# Patient Record
Sex: Male | Born: 1965 | Race: White | Hispanic: No | Smoking: Never smoker
Health system: Southern US, Community
[De-identification: ages and names within clinical notes are randomized; demographics above are authoritative.]

## PROBLEM LIST (undated history)

## (undated) DIAGNOSIS — G4733 Obstructive sleep apnea (adult) (pediatric): Secondary | ICD-10-CM

## (undated) DIAGNOSIS — E119 Type 2 diabetes mellitus without complications: Secondary | ICD-10-CM

## (undated) DIAGNOSIS — M1990 Unspecified osteoarthritis, unspecified site: Secondary | ICD-10-CM

## (undated) DIAGNOSIS — I10 Essential (primary) hypertension: Secondary | ICD-10-CM

---

## 2018-11-25 ENCOUNTER — Encounter (HOSPITAL_BASED_OUTPATIENT_CLINIC_OR_DEPARTMENT_OTHER): Payer: Self-pay | Admitting: *Deleted

## 2018-11-25 ENCOUNTER — Inpatient Hospital Stay (HOSPITAL_BASED_OUTPATIENT_CLINIC_OR_DEPARTMENT_OTHER)
Admission: EM | Admit: 2018-11-25 | Discharge: 2018-11-28 | DRG: 603 | Discharge status: Against Medical Advice | Payer: Medicare Other | Attending: Internal Medicine | Admitting: Internal Medicine

## 2018-11-25 ENCOUNTER — Other Ambulatory Visit: Payer: Self-pay

## 2018-11-25 DIAGNOSIS — Z886 Allergy status to analgesic agent status: Secondary | ICD-10-CM

## 2018-11-25 DIAGNOSIS — K219 Gastro-esophageal reflux disease without esophagitis: Secondary | ICD-10-CM | POA: Diagnosis present

## 2018-11-25 DIAGNOSIS — I10 Essential (primary) hypertension: Secondary | ICD-10-CM

## 2018-11-25 DIAGNOSIS — T63311A Toxic effect of venom of black widow spider, accidental (unintentional), initial encounter: Secondary | ICD-10-CM | POA: Diagnosis present

## 2018-11-25 DIAGNOSIS — Z79899 Other long term (current) drug therapy: Secondary | ICD-10-CM | POA: Diagnosis not present

## 2018-11-25 DIAGNOSIS — G4733 Obstructive sleep apnea (adult) (pediatric): Secondary | ICD-10-CM | POA: Diagnosis present

## 2018-11-25 DIAGNOSIS — L03311 Cellulitis of abdominal wall: Principal | ICD-10-CM | POA: Diagnosis present

## 2018-11-25 DIAGNOSIS — Z20828 Contact with and (suspected) exposure to other viral communicable diseases: Secondary | ICD-10-CM | POA: Diagnosis present

## 2018-11-25 DIAGNOSIS — L0390 Cellulitis, unspecified: Secondary | ICD-10-CM

## 2018-11-25 DIAGNOSIS — Z9989 Dependence on other enabling machines and devices: Secondary | ICD-10-CM

## 2018-11-25 DIAGNOSIS — F329 Major depressive disorder, single episode, unspecified: Secondary | ICD-10-CM | POA: Diagnosis present

## 2018-11-25 DIAGNOSIS — E1165 Type 2 diabetes mellitus with hyperglycemia: Secondary | ICD-10-CM | POA: Diagnosis not present

## 2018-11-25 DIAGNOSIS — E876 Hypokalemia: Secondary | ICD-10-CM | POA: Diagnosis present

## 2018-11-25 DIAGNOSIS — Z6841 Body Mass Index (BMI) 40.0 and over, adult: Secondary | ICD-10-CM | POA: Diagnosis not present

## 2018-11-25 DIAGNOSIS — Z794 Long term (current) use of insulin: Secondary | ICD-10-CM | POA: Diagnosis not present

## 2018-11-25 DIAGNOSIS — Z5329 Procedure and treatment not carried out because of patient's decision for other reasons: Secondary | ICD-10-CM | POA: Diagnosis present

## 2018-11-25 DIAGNOSIS — Z9119 Patient's noncompliance with other medical treatment and regimen: Secondary | ICD-10-CM | POA: Diagnosis not present

## 2018-11-25 HISTORY — DX: Obstructive sleep apnea (adult) (pediatric): G47.33

## 2018-11-25 HISTORY — DX: Essential (primary) hypertension: I10

## 2018-11-25 HISTORY — DX: Type 2 diabetes mellitus without complications: E11.9

## 2018-11-25 HISTORY — DX: Unspecified osteoarthritis, unspecified site: M19.90

## 2018-11-25 LAB — COMPREHENSIVE METABOLIC PANEL
ALT: 13 U/L (ref 0–44)
AST: 18 U/L (ref 15–41)
Albumin: 3.6 g/dL (ref 3.5–5.0)
Alkaline Phosphatase: 124 U/L (ref 38–126)
Anion gap: 14 (ref 5–15)
BUN: 16 mg/dL (ref 6–20)
CO2: 24 mmol/L (ref 22–32)
Calcium: 8.7 mg/dL — ABNORMAL LOW (ref 8.9–10.3)
Chloride: 94 mmol/L — ABNORMAL LOW (ref 98–111)
Creatinine, Ser: 0.84 mg/dL (ref 0.61–1.24)
GFR calc Af Amer: >60 mL/min (ref >60)
GFR calc non Af Amer: >60 mL/min (ref >60)
Glucose, Bld: 320 mg/dL — ABNORMAL HIGH (ref 70–99)
Potassium: 3.7 mmol/L (ref 3.5–5.1)
Sodium: 132 mmol/L — ABNORMAL LOW (ref 135–145)
Total Bilirubin: 0.6 mg/dL (ref 0.3–1.2)
Total Protein: 8.2 g/dL — ABNORMAL HIGH (ref 6.5–8.1)

## 2018-11-25 LAB — CBC WITH DIFFERENTIAL/PLATELET
Abs Immature Granulocytes: 0.04 10*3/uL (ref 0.00–0.07)
Basophils Absolute: 0.1 10*3/uL (ref 0.0–0.1)
Basophils Relative: 1 %
Eosinophils Absolute: 0.1 10*3/uL (ref 0.0–0.5)
Eosinophils Relative: 1 %
HCT: 43.1 % (ref 39.0–52.0)
Hemoglobin: 14.3 g/dL (ref 13.0–17.0)
Immature Granulocytes: 0 %
Lymphocytes Relative: 16 %
Lymphs Abs: 2 10*3/uL (ref 0.7–4.0)
MCH: 27.8 pg (ref 26.0–34.0)
MCHC: 33.2 g/dL (ref 30.0–36.0)
MCV: 83.7 fL (ref 80.0–100.0)
Monocytes Absolute: 0.5 10*3/uL (ref 0.1–1.0)
Monocytes Relative: 4 %
Neutro Abs: 9.6 10*3/uL — ABNORMAL HIGH (ref 1.7–7.7)
Neutrophils Relative %: 78 %
Platelets: 395 10*3/uL (ref 150–400)
RBC: 5.15 MIL/uL (ref 4.22–5.81)
RDW: 13.2 % (ref 11.5–15.5)
WBC: 12.3 10*3/uL — ABNORMAL HIGH (ref 4.0–10.5)
nRBC: 0 % (ref 0.0–0.2)

## 2018-11-25 LAB — URINALYSIS, ROUTINE W REFLEX MICROSCOPIC
Glucose, UA: >=500 mg/dL — AB
Hgb urine dipstick: NEGATIVE
Ketones, ur: 15 mg/dL — AB
Leukocytes,Ua: NEGATIVE
Nitrite: NEGATIVE
Protein, ur: NEGATIVE mg/dL
Specific Gravity, Urine: 1.025 (ref 1.005–1.030)
pH: 6 (ref 5.0–8.0)

## 2018-11-25 LAB — URINALYSIS, MICROSCOPIC (REFLEX)

## 2018-11-25 LAB — GLUCOSE, CAPILLARY: Glucose-Capillary: 231 mg/dL — ABNORMAL HIGH (ref 70–99)

## 2018-11-25 LAB — LACTIC ACID, PLASMA
Lactic Acid, Venous: 1.7 mmol/L (ref 0.5–1.9)
Lactic Acid, Venous: 1.5 mmol/L (ref 0.5–1.9)

## 2018-11-25 LAB — CBG MONITORING, ED
Glucose-Capillary: 287 mg/dL — ABNORMAL HIGH (ref 70–99)
Glucose-Capillary: 303 mg/dL — ABNORMAL HIGH (ref 70–99)

## 2018-11-25 MED ORDER — ACETAMINOPHEN 650 MG RE SUPP: 650.0000 mg | Freq: Four times a day (QID) | RECTAL | Status: DC | PRN

## 2018-11-25 MED ORDER — ACETAMINOPHEN 325 MG PO TABS
650.0000 mg | ORAL_TABLET | Freq: Four times a day (QID) | ORAL | Status: DC | PRN
  Administered 2018-11-26: 650 mg via ORAL
  Filled 2018-11-25: qty 2

## 2018-11-25 MED ORDER — SODIUM CHLORIDE 0.9% FLUSH: 3.0000 mL | INTRAVENOUS | Status: DC | PRN

## 2018-11-25 MED ORDER — DULOXETINE HCL 30 MG PO CPEP
30.0000 mg | ORAL_CAPSULE | Freq: Every day | ORAL | Status: DC
  Administered 2018-11-26 – 2018-11-27 (×2): 30 mg via ORAL
  Filled 2018-11-25 (×2): qty 1

## 2018-11-25 MED ORDER — PANTOPRAZOLE SODIUM 40 MG PO TBEC
40.0000 mg | DELAYED_RELEASE_TABLET | Freq: Every day | ORAL | Status: DC
  Administered 2018-11-26 – 2018-11-27 (×2): 40 mg via ORAL
  Filled 2018-11-25 (×2): qty 1

## 2018-11-25 MED ORDER — POLYETHYLENE GLYCOL 3350 17 G PO PACK: 17.0000 g | PACK | Freq: Every day | ORAL | Status: DC | PRN

## 2018-11-25 MED ORDER — SODIUM CHLORIDE 0.9% FLUSH: 3.0000 mL | Freq: Two times a day (BID) | INTRAVENOUS | Status: DC

## 2018-11-25 MED ORDER — VANCOMYCIN HCL 10 G IV SOLR
2000.0000 mg | Freq: Once | INTRAVENOUS | Status: AC
  Administered 2018-11-25: 22:00:00 2000 mg via INTRAVENOUS
  Filled 2018-11-25: qty 2000

## 2018-11-25 MED ORDER — LABETALOL HCL 5 MG/ML IV SOLN
10.0000 mg | INTRAVENOUS | Status: DC | PRN
  Filled 2018-11-25: qty 4

## 2018-11-25 MED ORDER — PIPERACILLIN-TAZOBACTAM 3.375 G IVPB 30 MIN
3.3750 g | Freq: Once | INTRAVENOUS | Status: AC
  Administered 2018-11-25: 21:00:00 3.375 g via INTRAVENOUS
  Filled 2018-11-25 (×2): qty 50

## 2018-11-25 MED ORDER — SODIUM CHLORIDE 0.9 % IV SOLN: 250.0000 mL | INTRAVENOUS | Status: DC | PRN

## 2018-11-25 MED ORDER — HYDROCODONE-ACETAMINOPHEN 5-325 MG PO TABS
1.0000 | ORAL_TABLET | ORAL | Status: DC | PRN
  Administered 2018-11-25 – 2018-11-27 (×2): 2 via ORAL
  Filled 2018-11-25 (×2): qty 2

## 2018-11-25 MED ORDER — ONDANSETRON HCL 4 MG PO TABS
4.0000 mg | ORAL_TABLET | Freq: Four times a day (QID) | ORAL | Status: DC | PRN
  Administered 2018-11-25: 4 mg via ORAL
  Filled 2018-11-25: qty 1

## 2018-11-25 MED ORDER — INSULIN DETEMIR 100 UNIT/ML ~~LOC~~ SOLN
25.0000 [IU] | Freq: Two times a day (BID) | SUBCUTANEOUS | Status: DC
  Administered 2018-11-25: 25 [IU] via SUBCUTANEOUS
  Filled 2018-11-25 (×2): qty 0.25

## 2018-11-25 MED ORDER — VANCOMYCIN HCL 10 G IV SOLR
1750.0000 mg | Freq: Two times a day (BID) | INTRAVENOUS | Status: DC
  Administered 2018-11-26 – 2018-11-27 (×4): 1750 mg via INTRAVENOUS
  Filled 2018-11-25 (×5): qty 1750

## 2018-11-25 MED ORDER — ONDANSETRON HCL 4 MG/2ML IJ SOLN: 4.0000 mg | Freq: Four times a day (QID) | INTRAMUSCULAR | Status: DC | PRN

## 2018-11-25 MED ORDER — INSULIN ASPART 100 UNIT/ML ~~LOC~~ SOLN
0.0000 [IU] | SUBCUTANEOUS | Status: DC
  Administered 2018-11-25: 3 [IU] via SUBCUTANEOUS

## 2018-11-25 MED ORDER — HYDRALAZINE HCL 20 MG/ML IJ SOLN
10.0000 mg | Freq: Once | INTRAMUSCULAR | Status: AC
  Administered 2018-11-25: 10 mg via INTRAVENOUS
  Filled 2018-11-25: qty 1

## 2018-11-25 MED ORDER — VANCOMYCIN HCL 1000 MG IV SOLR
INTRAVENOUS | Status: AC
  Filled 2018-11-25: qty 2000

## 2018-11-25 NOTE — Progress Notes (Signed)
Pharmacy Antibiotic Note  Zachary Salazar is a 53 y.o. male admitted on 11/25/2018 with cellulitis.  Pharmacy has been consulted for vancomycin dosing.  Presenting with a wound with skin necrosis (~1.5" near L lower abdomen). WBC 12.3, LA 1.7, afebrile. Scr 0.84 (normCrCl 102 mL/min). Currently receiving zosyn therapy.   Plan: Vancomycin 2g IV once then 1750 mg IV every 12 hours (estAUC 484)  Monitor renal fx, cx results, clinical pic, and vanc levels as appropriate  Height: 5\' 11"  (180.3 cm) Weight: (!) 342 lb (155.1 kg) IBW/kg (Calculated) : 75.3  Temp (24hrs), Avg:98.6 F (37 C), Min:98.6 F (37 C), Max:98.6 F (37 C)  Recent Labs  Lab 11/25/18 1843 11/25/18 1920  WBC 12.3*  --   CREATININE 0.84  --   LATICACIDVEN  --  1.7    Estimated Creatinine Clearance: 152.4 mL/min (by C-G formula based on SCr of 0.84 mg/dL).    No Known Allergies  Antimicrobials this admission: Zosyn 10/19 >>  Vancomycin 10/19 >>   Dose adjustments this admission: N/A  Microbiology results: 10/19 BCx: sent 10/19 COVID: sent   Thank you for allowing pharmacy to be a part of this patient's care.  Antonietta Jewel, PharmD, BCCCP Clinical Pharmacist  Phone: [phone]  Please check AMION for all [place] phone numbers After 10:00 PM, call Welcome [phone] 11/25/2018 8:37 PM

## 2018-11-25 NOTE — ED Triage Notes (Signed)
Wound with skin necrosis about 1.5" circular area to his left lower abdomen x 3 days. Nausea. Diabetic.

## 2018-11-25 NOTE — H&P (Signed)
History and Physical    Zachary Salazar ERX:[id number] DOB: 18-Feb-1965 DOA: 11/25/2018  PCP: Belia Heman, NP   Patient coming from: Home   Chief Complaint: Skin infection   HPI: Zachary Salazar is a 54 y.o. male with medical history significant for obesity (BMI 48), poorly controlled type 2 diabetes mellitus, hypertension, depression, and OSA, presenting to the emergency department for evaluation of a lower abdominal wound with foul odor and drainage.  Patient reports that he noticed a spot on his left lower abdominal wall approximately 3 days prior to presentation, states that there has been a foul odor and purulent drainage from the site.  Patient reports that he has been putting salt and Neosporin on the wound at home.  He has not noticed any fevers.  He denies any shortness of breath, cough, or chest pain.  [facility] ED Course: Upon arrival to the ED, patient is found to be afebrile, saturating well on room air, tachycardic to 120, and with stable blood pressure.  Chemistry panel is notable for a glucose of 320 and CBC features a leukocytosis to 12,300.  Blood cultures were collected and the patient was started on vancomycin and Zosyn in the ED.  COVID-19 testing is in process.  Review of Systems:  All other systems reviewed and apart from HPI, are negative.  Past Medical History:  Diagnosis Date  . Arthritis   . Diabetes mellitus without complication ([place])   . Hypertension   . OSA on CPAP     History reviewed. No pertinent surgical history.   reports that he has never smoked. He has never used smokeless tobacco. He reports that he does not drink alcohol or use drugs.  Allergies  Allergen Reactions  . Nsaids Other (See Comments)    GI Upset (intolerance) Hx of GIB     History reviewed. No pertinent family history.   Prior to Admission medications   Medication Sig Start Date End Date Taking? Authorizing Provider  DULoxetine (CYMBALTA) 30 MG capsule  Take 30 mg by mouth daily after breakfast.  11/19/18  Yes [provider]  hydrochlorothiazide (HYDRODIURIL) 25 MG tablet Take 25 mg by mouth daily after breakfast.  11/19/18  Yes [provider]  pantoprazole (PROTONIX) 40 MG tablet Take 40 mg by mouth daily after breakfast.  11/19/18  Yes [provider]  NOVOLOG MIX 70/30 (70-30) 100 UNIT/ML injection Inject 50-60 Units into the skin See admin instructions. Inject 60 units every morning and 50 units every evening 07/29/18   [provider]    Physical Exam: Vitals:   11/25/18 1831 11/25/18 2004 11/25/18 2130 11/25/18 2232  BP: (!) 180/107 (!) 153/92 (!) 148/96 (!) 161/111  Pulse: (!) 123 (!) 110 97 (!) 101  Resp: 20 18 16 20   Temp: 98.6 F (37 C)   98.4 F (36.9 C)  TempSrc: Oral   Oral  SpO2: 100% 100% 100% 99%  Weight:      Height:        Constitutional: NAD, calm  Eyes: PERTLA, lids and conjunctivae normal ENMT: Mucous membranes are moist. Posterior pharynx clear of any exudate or lesions.   Neck: normal, supple, no masses, no thyromegaly Respiratory: no wheezing, no crackles. Normal respiratory effort. No accessory muscle use.  Cardiovascular: S1 & S2 heard, regular rate and rhythm. No extremity edema.   Abdomen: No distension, no tenderness, soft. Bowel sounds normal.  Musculoskeletal: no clubbing / cyanosis. No joint deformity upper and lower extremities.  Skin: lower left abdominal wall wound with surrounding erythema, purulent drainage, and foul odor. Warm, dry, well-perfused. Neurologic: CN 2-12 grossly intact. Sensation intact. Strength 5/5 in all 4 limbs.  Psychiatric: Alert and oriented x 3. Calm, cooperative.     Labs on Admission: I have personally reviewed following labs and imaging studies  CBC: Recent Labs  Lab 11/25/18 1843  WBC 12.3*  NEUTROABS 9.6*  HGB 14.3  HCT 43.1  MCV 83.7  PLT 395   Basic Metabolic Panel: Recent Labs  Lab 11/25/18 1843  NA 132*  K  3.7  CL 94*  CO2 24  GLUCOSE 320*  BUN 16  CREATININE 0.84  CALCIUM 8.7*   GFR: Estimated Creatinine Clearance: 152.4 mL/min (by C-G formula based on SCr of 0.84 mg/dL). Liver Function Tests: Recent Labs  Lab 11/25/18 1843  AST 18  ALT 13  ALKPHOS 124  BILITOT 0.6  PROT 8.2*  ALBUMIN 3.6   No results for input(s): LIPASE, AMYLASE in the last 168 hours. No results for input(s): AMMONIA in the last 168 hours. Coagulation Profile: No results for input(s): INR, PROTIME in the last 168 hours. Cardiac Enzymes: No results for input(s): CKTOTAL, CKMB, CKMBINDEX, TROPONINI in the last 168 hours. BNP (last 3 results) No results for input(s): PROBNP in the last 8760 hours. HbA1C: No results for input(s): HGBA1C in the last 72 hours. CBG: Recent Labs  Lab 11/25/18 1840 11/25/18 1901  GLUCAP 287* 303*   Lipid Profile: No results for input(s): CHOL, HDL, LDLCALC, TRIG, CHOLHDL, LDLDIRECT in the last 72 hours. Thyroid Function Tests: No results for input(s): TSH, T4TOTAL, FREET4, T3FREE, THYROIDAB in the last 72 hours. Anemia Panel: No results for input(s): VITAMINB12, FOLATE, FERRITIN, TIBC, IRON, RETICCTPCT in the last 72 hours. Urine analysis:    Component Value Date/Time   COLORURINE YELLOW 11/25/2018 1843   APPEARANCEUR CLEAR 11/25/2018 1843   LABSPEC 1.025 11/25/2018 1843   PHURINE 6.0 11/25/2018 1843   GLUCOSEU >=500 (A) 11/25/2018 1843   HGBUR NEGATIVE 11/25/2018 1843   BILIRUBINUR SMALL (A) 11/25/2018 1843   KETONESUR 15 (A) 11/25/2018 1843   PROTEINUR NEGATIVE 11/25/2018 1843   NITRITE NEGATIVE 11/25/2018 1843   LEUKOCYTESUR NEGATIVE 11/25/2018 1843   Sepsis Labs: @LABRCNTIP (procalcitonin:4,lacticidven:4) )No results found for this or any previous visit (from the past 240 hour(s)).   Radiological Exams on Admission: No results found.  EKG: Not performed.   Assessment/Plan   1. Cellulitis  - Presents with 3 days of progressively enlarging lower left  abdominal wall wound with surrounding erythema, foul odor, and purulent drainage  - He is tachycardic with leukocytosis on admission  - He was cultured and started on broad-spectrum empiric antibiotics in ED  - Continue vancomycin, follow cultures and clinical course   2. Insulin-dependent DM  - A1c was 12.2% in January 2020  - Continue insulin with Levemir and Novolog correctional    3. Hypertension  - Hold HCTZ while hydrating, use labetalol IVP's as needed for now     PPE: Mask, face shield  DVT prophylaxis: SCD's  Code Status: Full  Family Communication: Discussed with patient  Consults called: None  Admission status: Inpatient. Patient has severe skin infection with systemic signs and symptoms, complicated by uncontrolled diabetes, may need surgical intervention if fails to respond to IV antibiotics, and will require inpatient management.     February 2020, MD [facility] Pager [phone]  If 7PM-7AM, please contact night-coverage [email or web address] Password [email or web address]  11/25/2018, 10:33 PM

## 2018-11-25 NOTE — ED Provider Notes (Signed)
[facility] EMERGENCY DEPARTMENT Provider Note   CSN: [id number] Arrival date & time: 11/25/18  1821     History   Chief Complaint Chief Complaint  Patient presents with  . Abscess    HPI Zachary Salazar is a 53 y.o. male.     The history is provided by the patient and medical records. No language interpreter was used.  Rash Location:  Torso and leg Torso rash location:  Abd LLQ Leg rash location:  L lower leg and R lower leg Quality: draining, painful and redness   Pain details:    Quality:  Aching   Onset quality:  Gradual   Duration:  3 days   Timing:  Constant   Progression:  Worsening Severity:  Moderate Progression:  Worsening Chronicity:  New Context: insect bite/sting (possibe)   Relieved by:  Nothing Worsened by:  Nothing Associated symptoms: abdominal pain (at site) and induration   Associated symptoms: no diarrhea, no fatigue (chills present), no fever, no headaches, no nausea, no shortness of breath, not vomiting and not wheezing     Past Medical History:  Diagnosis Date  . Arthritis   . Diabetes mellitus without complication ([facility])   . Hypertension     There are no active problems to display for this patient.   History reviewed. No pertinent surgical history.      Home Medications    Prior to Admission medications   Medication Sig Start Date End Date Taking? Authorizing Provider  DULoxetine (CYMBALTA) 30 MG capsule TAKE 1 CAPSULE BY MOUTH TWICE DAILY 11/19/18  Yes [provider]  hydrochlorothiazide (HYDRODIURIL) 25 MG tablet TAKE 1 TABLET(25 MG) BY MOUTH DAILY 11/19/18  Yes [provider]  pantoprazole (PROTONIX) 40 MG tablet TAKE 1 TABLET(40 MG) BY MOUTH DAILY 11/19/18  Yes [provider]    Family History No family history on file.  Social History Social History   Tobacco Use  . Smoking status: Never Smoker  . Smokeless tobacco: Never Used  Substance Use Topics  . Alcohol use: Never   Frequency: Never  . Drug use: Never     Allergies   Patient has no known allergies.   Review of Systems Review of Systems  Constitutional: Positive for chills. Negative for diaphoresis, fatigue (chills present) and fever.  HENT: Negative for congestion.   Eyes: Negative for visual disturbance.  Respiratory: Negative for cough, chest tightness, shortness of breath and wheezing.   Cardiovascular: Negative for chest pain, palpitations and leg swelling.  Gastrointestinal: Positive for abdominal pain (at site). Negative for constipation, diarrhea, nausea and vomiting.  Genitourinary: Negative for flank pain.  Musculoskeletal: Negative for back pain, neck pain and neck stiffness.  Skin: Positive for rash and wound.  Neurological: Negative for light-headedness and headaches.  Psychiatric/Behavioral: Negative for agitation.  All other systems reviewed and are negative.    Physical Exam Updated Vital Signs BP (!) 180/107   Pulse (!) 123   Temp 98.6 F (37 C) (Oral)   Resp 20   Ht  (1.803 m)   Wt (!) 155.1 kg   SpO2 100%   BMI 47.70 kg/m   Physical Exam Vitals signs and nursing note reviewed.  Constitutional:      General: He is not in acute distress.    Appearance: He is well-developed. He is not ill-appearing, toxic-appearing or diaphoretic.  HENT:     Head: Normocephalic and atraumatic.     Mouth/Throat:     Mouth: Mucous membranes are moist.  Eyes:     Conjunctiva/sclera: Conjunctivae normal.     Pupils: Pupils are equal, round, and reactive to light.  Neck:     Musculoskeletal: Neck supple. No muscular tenderness.  Cardiovascular:     Rate and Rhythm: Regular rhythm. Tachycardia present.     Pulses: Normal pulses.     Heart sounds: No murmur.  Pulmonary:     Effort: Pulmonary effort is normal. No respiratory distress.     Breath sounds: Normal breath sounds. No wheezing, rhonchi or rales.  Chest:     Chest wall: No tenderness.  Abdominal:     General:  Bowel sounds are normal.     Palpations: Abdomen is soft.     Tenderness: There is abdominal tenderness (at wound site) in the left lower quadrant. There is no right CVA tenderness or left CVA tenderness.    Musculoskeletal:        General: Tenderness present.     Right lower leg: He exhibits tenderness. No edema.     Left lower leg: He exhibits tenderness. No edema.     Comments: Erythema and wounds on bilateral calves with also no evidence of abscess on ultrasound.  Good pulse, sensation, and strength in legs.  Foul-smelling purulence present.  Skin:    General: Skin is warm and dry.     Capillary Refill: Capillary refill takes less than 2 seconds.     Findings: Erythema and rash present.  Neurological:     General: No focal deficit present.     Mental Status: He is alert.         ED Treatments / Results  Labs (all labs ordered are listed, but only abnormal results are displayed) Labs Reviewed  CBC WITH DIFFERENTIAL/PLATELET - Abnormal; Notable for the following components:      Result Value   WBC 12.3 (*)    Neutro Abs 9.6 (*)    All other components within normal limits  COMPREHENSIVE METABOLIC PANEL - Abnormal; Notable for the following components:   Sodium 132 (*)    Chloride 94 (*)    Glucose, Bld 320 (*)    Calcium 8.7 (*)    Total Protein 8.2 (*)    All other components within normal limits  URINALYSIS, ROUTINE W REFLEX MICROSCOPIC - Abnormal; Notable for the following components:   Glucose, UA >=500 (*)    Bilirubin Urine SMALL (*)    Ketones, ur 15 (*)    All other components within normal limits  URINALYSIS, MICROSCOPIC (REFLEX) - Abnormal; Notable for the following components:   Bacteria, UA FEW (*)    All other components within normal limits  GLUCOSE, CAPILLARY - Abnormal; Notable for the following components:   Glucose-Capillary 231 (*)    All other components within normal limits  CBG MONITORING, ED - Abnormal; Notable for the following components:    Glucose-Capillary 287 (*)    All other components within normal limits  CBG MONITORING, ED - Abnormal; Notable for the following components:   Glucose-Capillary 303 (*)    All other components within normal limits  CULTURE, BLOOD (ROUTINE X 2)  CULTURE, BLOOD (ROUTINE X 2)  SARS CORONAVIRUS 2 (TAT 6-24 HRS)  LACTIC ACID, PLASMA  LACTIC ACID, PLASMA  HEMOGLOBIN A1C  HIV ANTIBODY (ROUTINE TESTING W REFLEX)  BASIC METABOLIC PANEL  CBC WITH DIFFERENTIAL/PLATELET    EKG None  Radiology No results found.  Procedures Procedures (including critical care time)  CRITICAL CARE Performed by: Canary Brim [employer] Total critical  care time: 35 minutes Critical care time was exclusive of separately billable procedures and treating other patients. Critical care was necessary to treat or prevent imminent or life-threatening deterioration. Critical care was time spent personally by me on the following activities: development of treatment plan with patient and/or surrogate as well as nursing, discussions with consultants, evaluation of patient's response to treatment, examination of patient, obtaining history from patient or surrogate, ordering and performing treatments and interventions, ordering and review of laboratory studies, ordering and review of radiographic studies, pulse oximetry and re-evaluation of patient's condition.   Medications Ordered in ED Medications  vancomycin (VANCOCIN) 1,750 mg in sodium chloride 0.9 % 500 mL IVPB (has no administration in time range)  vancomycin (VANCOCIN) 1000 MG powder (has no administration in time range)  DULoxetine (CYMBALTA) DR capsule 30 mg (has no administration in time range)  pantoprazole (PROTONIX) EC tablet 40 mg (has no administration in time range)  sodium chloride flush (NS) 0.9 % injection 3 mL (3 mLs Intravenous Not Given 11/25/18 2256)  sodium chloride flush (NS) 0.9 % injection 3 mL (has no administration in time range)  0.9 %   sodium chloride infusion (has no administration in time range)  acetaminophen (TYLENOL) tablet 650 mg (has no administration in time range)    Or  acetaminophen (TYLENOL) suppository 650 mg (has no administration in time range)  HYDROcodone-acetaminophen (NORCO/VICODIN) 5-325 MG per tablet 1-2 tablet (2 tablets Oral Given 11/25/18 2330)  polyethylene glycol (MIRALAX / GLYCOLAX) packet 17 g (has no administration in time range)  ondansetron (ZOFRAN) tablet 4 mg (4 mg Oral Given 11/25/18 2330)    Or  ondansetron (ZOFRAN) injection 4 mg ( Intravenous See Alternative 11/25/18 2330)  insulin detemir (LEVEMIR) injection 25 Units (25 Units Subcutaneous Given 11/25/18 2330)  labetalol (NORMODYNE) injection 10 mg (has no administration in time range)  insulin aspart (novoLOG) injection 0-15 Units (has no administration in time range)  insulin aspart (novoLOG) injection 0-5 Units (has no administration in time range)  piperacillin-tazobactam (ZOSYN) IVPB 3.375 g (0 g Intravenous Stopped 11/25/18 2130)  vancomycin (VANCOCIN) 2,000 mg in sodium chloride 0.9 % 500 mL IVPB (2,000 mg Intravenous New Bag/Given 11/25/18 2132)  hydrALAZINE (APRESOLINE) injection 10 mg (10 mg Intravenous Given 11/25/18 2347)     Initial Impression / Assessment and Plan / ED Course  I have reviewed the triage vital signs and the nursing notes.  Pertinent labs & imaging results that were available during my care of the patient were reviewed by me and considered in my medical decision making (see chart for details).        Charlotte SanesLarry Darcey is a 53 y.o. male with a past medical history significant for hypertension and diabetes who presents with multiple skin infections.  Patient reports that for the last 3 days he has had a rapidly worsening wound develop on his left lower quadrant of the abdomen and both of his calves.  He reports that he thinks he was "bit by a spider or something", but he does not remember actually seeing an  insect.  He says that over the last 24 hours it has enlarged and has looked black around the edges with purulence and foul smell.  There is also surrounding erythema that seems to be spreading slightly.  He reports he tried to use some Neosporin and some salt water solution that did not seem to help.  He reports he also has wounds developing on both of his calves that are similar but  has not yet opened and started draining.  Patient denies any fevers he does report he has been chills and not take his temperature at home.  He reports is at the nausea no vomiting.  He denies any other abdominal pain or chest pain.  On exam, patient has a foul-smelling 2 cm wound on his left lower quadrant.  See picture.  Bedside ultrasound utilized with no evidence of abscess underlying.  Abdomen is tender in this location.  It is foul-smelling.  Patient also has 2 areas of erythema on his calves on both legs.  No abscess seen on ultrasound of them.  Good pulse, sensation, and strength in legs.  Patient is warm to the touch and is tachycardic and tachypneic on arrival.  Clinical I am concerned patient is having a diabetic cellulitis that is rapidly worsening with some necrosis on the edges.  Patient will have screening labs and will be given antibiotics.  Labs show leukocytosis.  Lactic acid is not elevated.  Based on the fact I do not see abscess tracking downward, do not feel he needs CT abdomen pelvis at this time however if symptoms were to worsen or he was to have more diffuse abdominal pain, would consider CT to further evaluate.  Patient was given broad-spectrum antibiotics as he has concerning vital signs with these areas of infection.  Spoke with Dr. Myna Hidalgo with hospital service who agrees for admission.   Patient be admitted for further management of diabetic cellulitis.   Final Clinical Impressions(s) / ED Diagnoses   Final diagnoses:  Cellulitis of abdominal wall    ED Discharge Orders    None      Clinical Impression: 1. Cellulitis of abdominal wall     Disposition: Admit  This note was prepared with assistance of Dragon voice recognition software. Occasional wrong-word or sound-a-like substitutions may have occurred due to the inherent limitations of voice recognition software.     Masayuki Sakai, Gwenyth Allegra, MD 11/26/18 [phone]

## 2018-11-26 DIAGNOSIS — L03311 Cellulitis of abdominal wall: Secondary | ICD-10-CM | POA: Diagnosis not present

## 2018-11-26 LAB — GLUCOSE, RANDOM: Glucose, Bld: 449 mg/dL — ABNORMAL HIGH (ref 70–99)

## 2018-11-26 LAB — BASIC METABOLIC PANEL
Anion gap: 11 (ref 5–15)
BUN: 22 mg/dL — ABNORMAL HIGH (ref 6–20)
CO2: 23 mmol/L (ref 22–32)
Calcium: 8.2 mg/dL — ABNORMAL LOW (ref 8.9–10.3)
Chloride: 96 mmol/L — ABNORMAL LOW (ref 98–111)
Creatinine, Ser: 0.94 mg/dL (ref 0.61–1.24)
GFR calc Af Amer: >60 mL/min (ref >60)
GFR calc non Af Amer: >60 mL/min (ref >60)
Glucose, Bld: 421 mg/dL — ABNORMAL HIGH (ref 70–99)
Potassium: 3.2 mmol/L — ABNORMAL LOW (ref 3.5–5.1)
Sodium: 130 mmol/L — ABNORMAL LOW (ref 135–145)

## 2018-11-26 LAB — CBC WITH DIFFERENTIAL/PLATELET
Abs Immature Granulocytes: 0.05 10*3/uL (ref 0.00–0.07)
Basophils Absolute: 0.1 10*3/uL (ref 0.0–0.1)
Basophils Relative: 0 %
Eosinophils Absolute: 0.2 10*3/uL (ref 0.0–0.5)
Eosinophils Relative: 2 %
HCT: 39.1 % (ref 39.0–52.0)
Hemoglobin: 13 g/dL (ref 13.0–17.0)
Immature Granulocytes: 0 %
Lymphocytes Relative: 23 %
Lymphs Abs: 2.6 10*3/uL (ref 0.7–4.0)
MCH: 28.1 pg (ref 26.0–34.0)
MCHC: 33.2 g/dL (ref 30.0–36.0)
MCV: 84.4 fL (ref 80.0–100.0)
Monocytes Absolute: 0.6 10*3/uL (ref 0.1–1.0)
Monocytes Relative: 5 %
Neutro Abs: 7.9 10*3/uL — ABNORMAL HIGH (ref 1.7–7.7)
Neutrophils Relative %: 70 %
Platelets: 319 10*3/uL (ref 150–400)
RBC: 4.63 MIL/uL (ref 4.22–5.81)
RDW: 13.2 % (ref 11.5–15.5)
WBC: 11.3 10*3/uL — ABNORMAL HIGH (ref 4.0–10.5)
nRBC: 0 % (ref 0.0–0.2)

## 2018-11-26 LAB — SARS CORONAVIRUS 2 (TAT 6-24 HRS): SARS Coronavirus 2: NEGATIVE

## 2018-11-26 LAB — GLUCOSE, CAPILLARY
Glucose-Capillary: 466 mg/dL — ABNORMAL HIGH (ref 70–99)
Glucose-Capillary: 398 mg/dL — ABNORMAL HIGH (ref 70–99)
Glucose-Capillary: 84 mg/dL (ref 70–99)
Glucose-Capillary: 291 mg/dL — ABNORMAL HIGH (ref 70–99)
Glucose-Capillary: 192 mg/dL — ABNORMAL HIGH (ref 70–99)

## 2018-11-26 LAB — HEMOGLOBIN A1C
Hgb A1c MFr Bld: 15.8 % — ABNORMAL HIGH (ref 4.8–5.6)
Mean Plasma Glucose: 406.76 mg/dL

## 2018-11-26 LAB — HIV ANTIBODY (ROUTINE TESTING W REFLEX): HIV Screen 4th Generation wRfx: NONREACTIVE

## 2018-11-26 MED ORDER — INSULIN ASPART 100 UNIT/ML ~~LOC~~ SOLN: 0.0000 [IU] | Freq: Every day | SUBCUTANEOUS | Status: DC

## 2018-11-26 MED ORDER — INSULIN ASPART 100 UNIT/ML ~~LOC~~ SOLN
12.0000 [IU] | Freq: Once | SUBCUTANEOUS | Status: AC
  Administered 2018-11-26: 08:00:00 12 [IU] via SUBCUTANEOUS

## 2018-11-26 MED ORDER — SENNOSIDES-DOCUSATE SODIUM 8.6-50 MG PO TABS: 2.0000 | ORAL_TABLET | Freq: Every evening | ORAL | Status: DC | PRN

## 2018-11-26 MED ORDER — POLYETHYLENE GLYCOL 3350 17 G PO PACK: 17.0000 g | PACK | Freq: Every day | ORAL | Status: DC | PRN

## 2018-11-26 MED ORDER — INSULIN GLARGINE 100 UNIT/ML ~~LOC~~ SOLN
45.0000 [IU] | Freq: Every day | SUBCUTANEOUS | Status: DC
  Administered 2018-11-26: 10:00:00 45 [IU] via SUBCUTANEOUS
  Filled 2018-11-26 (×2): qty 0.45

## 2018-11-26 MED ORDER — INSULIN ASPART 100 UNIT/ML ~~LOC~~ SOLN
0.0000 [IU] | Freq: Three times a day (TID) | SUBCUTANEOUS | Status: DC
  Administered 2018-11-26: 09:00:00 15 [IU] via SUBCUTANEOUS
  Administered 2018-11-26: 17:00:00 11 [IU] via SUBCUTANEOUS
  Administered 2018-11-27: 3 [IU] via SUBCUTANEOUS
  Administered 2018-11-27: 12:00:00 4 [IU] via SUBCUTANEOUS

## 2018-11-26 MED ORDER — INSULIN ASPART 100 UNIT/ML ~~LOC~~ SOLN
6.0000 [IU] | Freq: Three times a day (TID) | SUBCUTANEOUS | Status: DC
  Administered 2018-11-26: 17:00:00 6 [IU] via SUBCUTANEOUS

## 2018-11-26 MED ORDER — HYDRALAZINE HCL 20 MG/ML IJ SOLN: 10.0000 mg | INTRAMUSCULAR | Status: DC | PRN

## 2018-11-26 MED ORDER — INSULIN ASPART 100 UNIT/ML ~~LOC~~ SOLN: 0.0000 [IU] | Freq: Three times a day (TID) | SUBCUTANEOUS | Status: DC

## 2018-11-26 MED ORDER — POTASSIUM CHLORIDE CRYS ER 20 MEQ PO TBCR
40.0000 meq | EXTENDED_RELEASE_TABLET | Freq: Once | ORAL | Status: AC
  Administered 2018-11-26: 13:00:00 40 meq via ORAL
  Filled 2018-11-26: qty 2

## 2018-11-26 NOTE — Consult Note (Signed)
[facility] Nurse wound consult note Reason for Consult:LEft abdomen cellulitis  Wound type: infectious Pressure Injury POA: NA Measurement: 4 cm x 3.2 cm x 0.4 cm  Wound WGY:KZLDJT and ruddy red with fibrinous tissue Drainage (amount, consistency, odor) minimal serosanguinous  Necrotic odor Periwound:erythema Dressing procedure/placement/frequency:Cleanse wound to left flank with NS and pat dry.  Fill wound depth with piece of Aquacel Ag.  Cover with ABD pad and tape.  Change twice weekly Tuesday and Saturday.  Will not follow at this time.  Please re-consult if needed.  Domenic Moras MSN, RN, FNP-BC CWON Wound, Ostomy, Continence Nurse Pager [phone]

## 2018-11-26 NOTE — Progress Notes (Signed)
Inpatient Diabetes Program Recommendations  AACE/ADA: New Consensus Statement on Inpatient Glycemic Control (2015)  Target Ranges:  Prepandial:   less than 140 mg/dL      Peak postprandial:   less than 180 mg/dL (1-2 hours)      Critically ill patients:  140 - 180 mg/dL   Lab Results  Component Value Date   GLUCAP 84 11/26/2018   HGBA1C 15.8 (H) 11/26/2018    Review of Glycemic Control  Diabetes history: DM2 Outpatient Diabetes medications: Novolog 70/30 60 units QAM and 50 units QPM Current orders for Inpatient glycemic control: Lantus 45 units QD, Novolog 0-20 units tidwc and 0-5 units QHS + 6 units tidwc  HgbA1C - 15.8% CBGs today 421, 466, 398, 84 mg/dL Eating 100%.  Inpatient Diabetes Program Recommendations:     Agree with orders. Will likely need more meal coverage insulin. Will hold off on titrating Novolog since 84 mg/dL at lunch.   Will speak with pt regarding HgbA1C of 15.8%. It is doubtful pt is taking his insulin at home.   Continue to follow.  Thank you. Lorenda Peck, RD, LDN, CDE Inpatient Diabetes Coordinator [phone]

## 2018-11-26 NOTE — Progress Notes (Signed)
PROGRESS NOTE    Zachary Salazar  VOZ:[id number] DOB: 03/15/1965 DOA: 11/25/2018 PCP: Sharren Bridge, NP   Brief Narrative:  53 year old with history of poorly controlled diabetes mellitus type 2, morbid obesity, depression, essential hypertension, obstructive sleep apnea presented with lower abdominal wound with foul smelling drainage.  In the ER she was noted to be hyperglycemic with mild leukocytosis.  Started on vancomycin and Zosyn for abdominal wall cellulitis   Assessment & Plan:   Principal Problem:   Cellulitis of left abdominal wall Active Problems:   Uncontrolled diabetes mellitus with hyperglycemia (HCC)   Hypertension   OSA on CPAP  Abdominal wall cellulitis with purulent drainage -Continue IV vancomycin -Follow-up blood cultures.  IV fluids -Supportive care -If necessary will get wound cultures  Insulin-dependent diabetes mellitus type 2, poorly controlled secondary to significant hyperglycemia -Continue Levemir and insulin sliding scale -Check hemoglobin A1c, previously was 12 -Consult diabetic coordinator  Essential hypertension -Holding off on hydrochlorothiazide.  Gentle hydration.    DVT prophylaxis: SCDs if needed Code Status: Full code Family Communication: None at bedside 53 year old with history of poorly controlled diabetes mellitus type 2, morbid obesity, depression, essential hypertension, obstructive sleep apnea presented with lower abdominal wound with foul smelling drainage.  In the ER she was noted to be hyperglycemic with mild leukocytosis.  Started on vancomycin and Zosyn for abdominal wall cellulitis   Assessment & Plan:   Principal Active Problems:   Uncontrolled diabetes mellitus with hyperglycemia (HCC)   Hypertension   OSA on CPAP  Abdominal wall cellulitis with purulent drainage -Continue IV vancomycin -Follow-up blood cultures.  IV fluids -Supportive care -If necessary will get wound cultures  Insulin-dependent diabetes mellitus type 2, poorly controlled secondary to significant hyperglycemia -Continue Levemir and insulin sliding scale -Check hemoglobin A1c, previously was 12 -Consult diabetic coordinator  Essential hypertension -Holding off on hydrochlorothiazide.  Gentle hydration.    DVT prophylaxis: SCDs if needed Code Status: Full code Family Communication: None at bedside Disposition Plan: Maintain hospital stay for IV antibiotics and better control of his blood glucose.  Control his blood glucose will be very important to help his healing  Consultants:   Diabetic coordinator  Procedures:   None  Antimicrobials:   Vancomycin day 2   Subjective: Tells me he was working outside the building couple of days ago when he thinks he may have been bitten by something.  First tells me he was bitten by a black widow and then brown recluse.  Later on goes on telling me he does not know.  Worsened over the course the last 2 days therefore came to the hospital.  Review of Systems Otherwise negative except as per HPI, including: General: Denies  fever, chills, night sweats or unintended weight loss. Resp: Denies cough, wheezing, shortness of breath. Cardiac: Denies chest pain, palpitations, orthopnea, paroxysmal nocturnal dyspnea. GI: Denies abdominal pain, nausea, vomiting, diarrhea or constipation GU: Denies dysuria, frequency, hesitancy or incontinence MS: Denies muscle aches, joint pain or swelling Neuro: Denies headache, neurologic deficits (focal weakness, numbness, tingling), abnormal gait Psych: Denies anxiety, depression, SI/HI/AVH Skin: Denies new rashes or lesions ID: Denies sick contacts, exotic exposures, travel  Objective: Vitals:   11/25/18 2004 11/25/18 2130 11/25/18 2232 11/26/18 0550  BP: (!) 153/92 (!) 148/96 (!) 161/111 (!) 148/83  Pulse: (!) 110 97 (!) 101 95  Resp: 18 16 20 20   Temp:   98.4 F (36.9 C) 98 F (36.7 C)  TempSrc:   Oral Oral  SpO2: 100% 100% 99% 98%  Weight:   (!) 156.7 kg   Height:   5\' 11"  (1.803 m)     Intake/Output Summary (Last 24 hours) at 11/26/2018 0832 Last data filed at 11/26/2018 0550 Gross per 24 hour  Intake 43.42 ml  Output 600 ml  Net -556.58 ml   Filed Weights   11/25/18 1829 11/25/18 2232  Weight: (!) 155.1 kg (!) 156.7 kg    Examination:  General exam: Appears calm and comfortable  Respiratory system: Clear to auscultation. Respiratory effort normal. Cardiovascular system: S1 & S2 heard, RRR. No JVD, murmurs, rubs, gallops or clicks. No pedal edema. Gastrointestinal system: Abdomen is nondistended, soft and nontender. No organomegaly or masses felt. Normal bowel sounds heard. Central nervous system: Alert and oriented. No focal neurological deficits. Extremities: Symmetric 5 x 5 power. Skin: Left lower quadrant superficial ulcerative area with surrounding erythema and mild pruritus.  Left medial calf scab with surrounding erythema, right posterior calf scab with very mild surrounding erythema. Psychiatry: Judgement and insight appear normal. Mood & affect  appropriate.     Data Reviewed:   CBC: Recent Labs  Lab 11/25/18 1843 11/26/18 0357  WBC 12.3* 11.3*  NEUTROABS 9.6* 7.9*  HGB 14.3 13.0  HCT 43.1 39.1  MCV 83.7 84.4  PLT 395 319   Basic Metabolic Panel: Recent Labs  Lab 11/25/18 1843 11/26/18 0357 11/26/18 0758  NA 132* 130*  --   K 3.7 3.2*  --   CL 94* 96*  --   CO2 24 23  --   GLUCOSE 320* 421* 449*  BUN 16 22*  --   CREATININE 0.84 0.94  --   CALCIUM 8.7* 8.2*  --    GFR: Estimated Creatinine Clearance: 137.1 mL/min (by C-G formula based on SCr of 0.94 mg/dL). Liver Function Tests: Recent Labs  Lab 11/25/18 1843  AST 18  ALT 13  ALKPHOS 124  BILITOT 0.6  PROT 8.2*  ALBUMIN 3.6   No results for input(s): LIPASE, AMYLASE in the last 168 hours. No results for input(s): AMMONIA in the last 168 hours. Coagulation Profile: No results for input(s): INR, PROTIME in the last 168 hours. Cardiac Enzymes: No results for input(s): CKTOTAL, CKMB, CKMBINDEX, TROPONINI in the last 168 hours. BNP (last 3 results) No results for input(s): PROBNP in the last 8760 hours. HbA1C: No results for input(s): HGBA1C in the last 72 hours. CBG: Recent Labs  Lab 11/25/18 1840 11/25/18 1901 11/25/18 2314 11/26/18 0708  GLUCAP 287* 303* 231* 466*   Lipid Profile: No results for input(s): CHOL, HDL, LDLCALC, TRIG, CHOLHDL, LDLDIRECT in the last 72 hours. Thyroid Function Tests: No results for input(s): TSH, T4TOTAL, FREET4, T3FREE, THYROIDAB in the last 72 hours. Anemia Panel: No results for input(s): VITAMINB12, FOLATE, FERRITIN, TIBC, IRON, RETICCTPCT in the last 72 hours. Sepsis Labs: Recent Labs  Lab 11/25/18 1920 11/25/18 2100  LATICACIDVEN 1.7 1.5    Recent Results (from the past 240 hour(s))  SARS CORONAVIRUS 2 (TAT 6-24 HRS) Nasopharyngeal Nasopharyngeal Swab     Status: None   Collection Time: 11/25/18  8:49 PM   Specimen: Nasopharyngeal Swab  Result Value Ref Range Status   SARS Coronavirus 2  NEGATIVE NEGATIVE Final    Comment: (NOTE) SARS-CoV-2 target nucleic acids are NOT DETECTED. The SARS-CoV-2 RNA is generally detectable in upper and lower respiratory specimens during the acute phase of infection. Negative results do not preclude SARS-CoV-2 infection, do not rule out co-infections with other pathogens, a for IV antibiotics and better control of his blood glucose.  Control his blood glucose will be very important to help his healing  Consultants:   Diabetic coordinator  Procedures:   None  Antimicrobials:   Vancomycin day 2   Subjective: Tells me he was working outside the building couple of days ago when he thinks he may have been bitten by something.  First tells me he was bitten by a black widow and then brown recluse.  Later on goes on telling me he does not know.  Worsened over the course the last 2 days therefore came to the hospital.  Review of Systems Otherwise negative except as per HPI, including: General: Denies  fever, chills, night sweats or unintended weight loss. Resp: Denies cough, wheezing, shortness of breath. Cardiac: Denies chest pain, palpitations, orthopnea, paroxysmal nocturnal dyspnea. GI: Denies abdominal pain, nausea, vomiting, diarrhea or constipation GU: Denies dysuria, frequency, hesitancy or incontinence MS: Denies muscle aches, joint pain or swelling Neuro: Denies headache, neurologic deficits (focal weakness, numbness, tingling), abnormal gait Psych: Denies anxiety, depression, SI/HI/AVH Skin: Denies new rashes or lesions ID: Denies sick contacts, exotic exposures, travel  Objective: Vitals:   11/25/18 2004 11/25/18 2130 11/25/18 2232 11/26/18 0550  BP: (!) 153/92 (!) 148/96 (!) 161/111 (!) 148/83  Pulse: (!) 110 97 (!) 101 95  Resp: 18 16 20 20   Temp:   98.4 F (36.9 C) 98 F (36.7 C)  TempSrc:   Oral Oral  SpO2: 100% 100% 99% 98%  Weight:   (!) 156.7 kg   Height:   5\' 11"  (1.803 m)     Intake/Output Summary (Last 24 hours) at 11/26/2018 0832 Last data filed at 11/26/2018 0550 Gross per 24 hour  Intake 43.42 ml  Output 600 ml  Net -556.58 ml   Filed Weights   11/25/18 1829 11/25/18 2232  Weight: (!) 155.1 kg (!) 156.7 kg    Examination:  General exam: Appears calm and comfortable  Respiratory system: Clear to auscultation. Respiratory effort normal. Cardiovascular system: S1 & S2 heard, RRR. No JVD, murmurs, rubs, gallops or clicks. No pedal edema. Gastrointestinal system: Abdomen is nondistended, soft and nontender. No organomegaly or masses felt. Normal bowel sounds heard. Central nervous system: Alert and oriented. No focal neurological deficits. Extremities: Symmetric 5 x 5 power. Skin: Left lower quadrant superficial ulcerative area with surrounding erythema and mild pruritus.  Left medial calf scab with surrounding erythema, right posterior calf scab with very mild surrounding erythema. Psychiatry: Judgement and insight appear normal. Mood & affect  appropriate.     Data Reviewed:   CBC: Recent Labs  Lab 11/25/18 1843 11/26/18 0357  WBC 12.3* 11.3*  NEUTROABS 9.6* 7.9*  HGB 14.3 13.0  HCT 43.1 39.1  MCV 83.7 84.4  PLT 395 319   Basic Metabolic Panel: Recent Labs  Lab 11/25/18 1843 11/26/18 0357 11/26/18 0758  NA 132* 130*  --   K 3.7 3.2*  --   CL 94* 96*  --   CO2 24 23  --   GLUCOSE 320* 421* 449*  BUN 16 22*  --   CREATININE 0.84 0.94  --   CALCIUM 8.7* 8.2*  --    GFR: Estimated Creatinine Clearance: 137.1 mL/min (by C-G formula based on SCr of 0.94 mg/dL). Liver Function Tests: Recent Labs  Lab 11/25/18 1843  AST 18  ALT 13  ALKPHOS 124  BILITOT 0.6  PROT 8.2*  ALBUMIN 3.6   No results for input(s): LIPASE, AMYLASE in the last 168 hours. No results for input(s): AMMONIA in the last 168 hours. Coagulation Profile: No results for input(s): INR, PROTIME in the last 168 hours. Cardiac Enzymes: No results for input(s): CKTOTAL, CKMB, CKMBINDEX, TROPONINI in the last 168 hours. BNP (last 3 results) No results for input(s): PROBNP in the last 8760 hours. HbA1C: No results for input(s): HGBA1C in the last 72 hours. CBG: Recent Labs  Lab 11/25/18 1840 11/25/18 1901 11/25/18 2314 11/26/18 0708  GLUCAP 287* 303* 231* 466*   Lipid Profile: No results for input(s): CHOL, HDL, LDLCALC, TRIG, CHOLHDL, LDLDIRECT in the last 72 hours. Thyroid Function Tests: No results for input(s): TSH, T4TOTAL, FREET4, T3FREE, THYROIDAB in the last 72 hours. Anemia Panel: No results for input(s): VITAMINB12, FOLATE, FERRITIN, TIBC, IRON, RETICCTPCT in the last 72 hours. Sepsis Labs: Recent Labs  Lab 11/25/18 1920 11/25/18 2100  LATICACIDVEN 1.7 1.5    Recent Results (from the past 240 hour(s))  SARS CORONAVIRUS 2 (TAT 6-24 HRS) Nasopharyngeal Nasopharyngeal Swab     Status: None   Collection Time: 11/25/18  8:49 PM   Specimen: Nasopharyngeal Swab  Result Value Ref Range Status   SARS Coronavirus 2  NEGATIVE NEGATIVE Final    Comment: (NOTE) SARS-CoV-2 target nucleic acids are NOT DETECTED. The SARS-CoV-2 RNA is generally detectable in upper and lower respiratory specimens during the acute phase of infection. Negative results do not preclude SARS-CoV-2 infection, do not rule out co-infections with other pathogens, and should not be used as the sole basis for treatment or other patient management decisions. Negative results must be combined with clinical observations, patient history, and epidemiological information. The expected result is Negative. Fact Sheet for Patients: HairSlick.nohttps://www.fda.gov/media/138098/download Fact Sheet for Healthcare Providers: quierodirigir.comhttps://www.fda.gov/media/138095/download This test is not yet approved or cleared by the [place] FDA and  has been authorized for detection and/or diagnosis of SARS-CoV-2 by FDA under an Emergency Use Authorization (EUA). This EUA will remain  in effect (meaning this test can be used) for the duration of the COVID-19 declaration under Section 56 4(b)(1) of the Act, 21 U.S.C. section 360bbb-3(b)(1), unless the authorization is terminated or revoked sooner. Performed at [facility], [address] [place], [place] [address]          Radiology Studies: No results found.      Scheduled Meds: . DULoxetine  30 mg Oral QPC breakfast  .  insulin aspart  0-15 Units Subcutaneous TID WC  . insulin aspart  0-5 Units Subcutaneous QHS  . insulin detemir  25 Units Subcutaneous BID  . pantoprazole  40 mg Oral QPC breakfast  . sodium chloride flush  3 mL Intravenous Q12H  . vancomycin       Continuous Infusions: . sodium chloride    . vancomycin       LOS: 1 day   Time spent= 25 mins     Arsenio Loader, MD [facility]  If 7PM-7AM, please contact night-coverage  11/26/2018, 8:32 AM

## 2018-11-26 NOTE — Progress Notes (Signed)
Pt morning labs show blood glucose 421. Rechecked and it was 466. MD amin paged ordered STAT lab vairfication and 12 units Novolog.

## 2018-11-27 ENCOUNTER — Inpatient Hospital Stay (HOSPITAL_COMMUNITY): Payer: Medicare Other

## 2018-11-27 DIAGNOSIS — L03311 Cellulitis of abdominal wall: Principal | ICD-10-CM

## 2018-11-27 LAB — COMPREHENSIVE METABOLIC PANEL
ALT: 11 U/L (ref 0–44)
AST: 14 U/L — ABNORMAL LOW (ref 15–41)
Albumin: 3 g/dL — ABNORMAL LOW (ref 3.5–5.0)
Alkaline Phosphatase: 92 U/L (ref 38–126)
Anion gap: 12 (ref 5–15)
BUN: 19 mg/dL (ref 6–20)
CO2: 21 mmol/L — ABNORMAL LOW (ref 22–32)
Calcium: 8.4 mg/dL — ABNORMAL LOW (ref 8.9–10.3)
Chloride: 101 mmol/L (ref 98–111)
Creatinine, Ser: 1.01 mg/dL (ref 0.61–1.24)
GFR calc Af Amer: >60 mL/min (ref >60)
GFR calc non Af Amer: >60 mL/min (ref >60)
Glucose, Bld: 179 mg/dL — ABNORMAL HIGH (ref 70–99)
Potassium: 3.4 mmol/L — ABNORMAL LOW (ref 3.5–5.1)
Sodium: 134 mmol/L — ABNORMAL LOW (ref 135–145)
Total Bilirubin: 0.8 mg/dL (ref 0.3–1.2)
Total Protein: 6.9 g/dL (ref 6.5–8.1)

## 2018-11-27 LAB — CBC
HCT: 40.5 % (ref 39.0–52.0)
Hemoglobin: 13.2 g/dL (ref 13.0–17.0)
MCH: 27.9 pg (ref 26.0–34.0)
MCHC: 32.6 g/dL (ref 30.0–36.0)
MCV: 85.6 fL (ref 80.0–100.0)
Platelets: 300 10*3/uL (ref 150–400)
RBC: 4.73 MIL/uL (ref 4.22–5.81)
RDW: 13.4 % (ref 11.5–15.5)
WBC: 9.1 10*3/uL (ref 4.0–10.5)
nRBC: 0 % (ref 0.0–0.2)

## 2018-11-27 LAB — MRSA PCR SCREENING: MRSA by PCR: NEGATIVE

## 2018-11-27 LAB — GLUCOSE, CAPILLARY
Glucose-Capillary: 192 mg/dL — ABNORMAL HIGH (ref 70–99)
Glucose-Capillary: 170 mg/dL — ABNORMAL HIGH (ref 70–99)
Glucose-Capillary: 130 mg/dL — ABNORMAL HIGH (ref 70–99)
Glucose-Capillary: 158 mg/dL — ABNORMAL HIGH (ref 70–99)

## 2018-11-27 LAB — MAGNESIUM: Magnesium: 1.9 mg/dL (ref 1.7–2.4)

## 2018-11-27 IMAGING — US US ABDOMEN LIMITED
1 series · 12 of 12 positions shown · non-contrast
Comparison: None.

CLINICAL DATA: 53-year-old male with cellulitis.

EXAM:
ULTRASOUND ABDOMEN LIMITED

[Series 1: us abdomen limited · 12 of 12 slices shown]
[im 1/12]
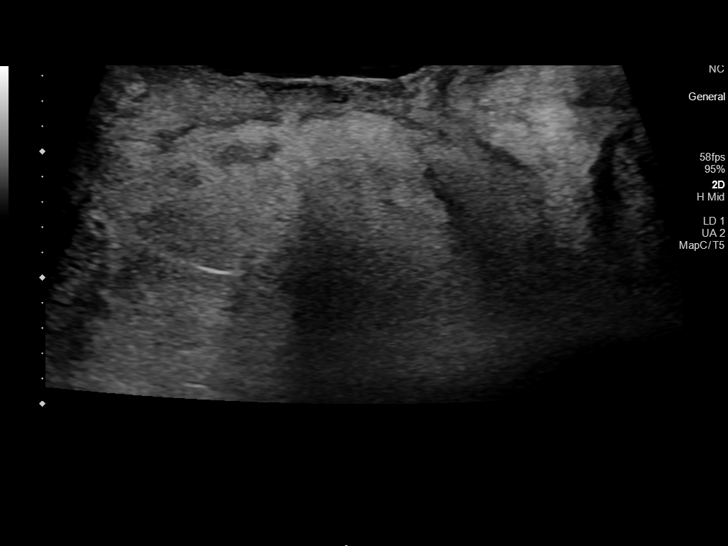
[im 2/12]
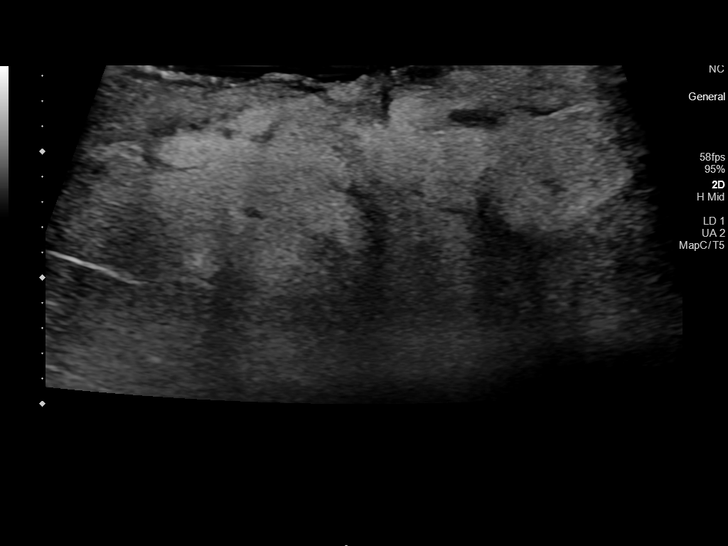
[im 3/12]
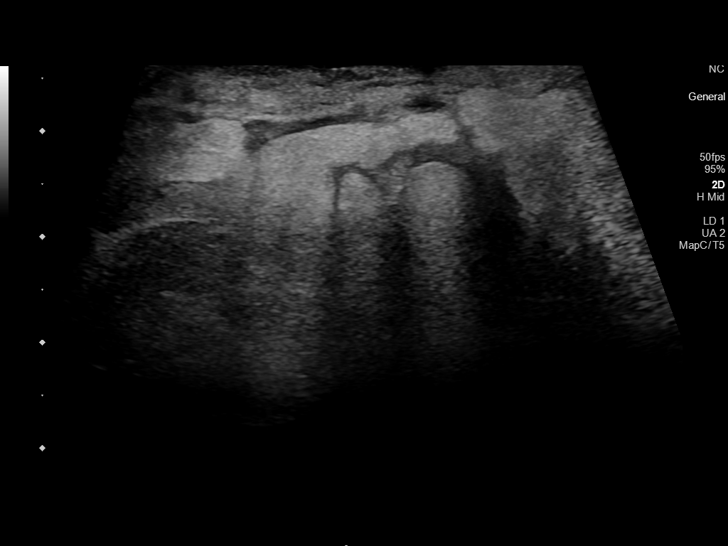
[im 4/12]
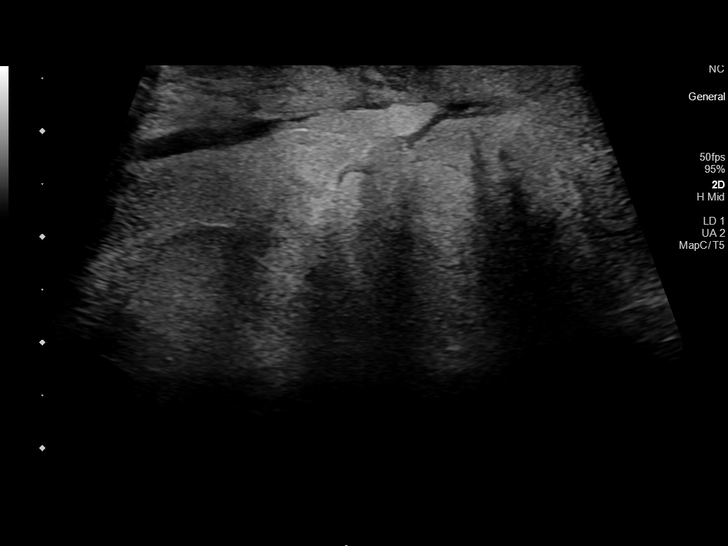
[im 5/12]
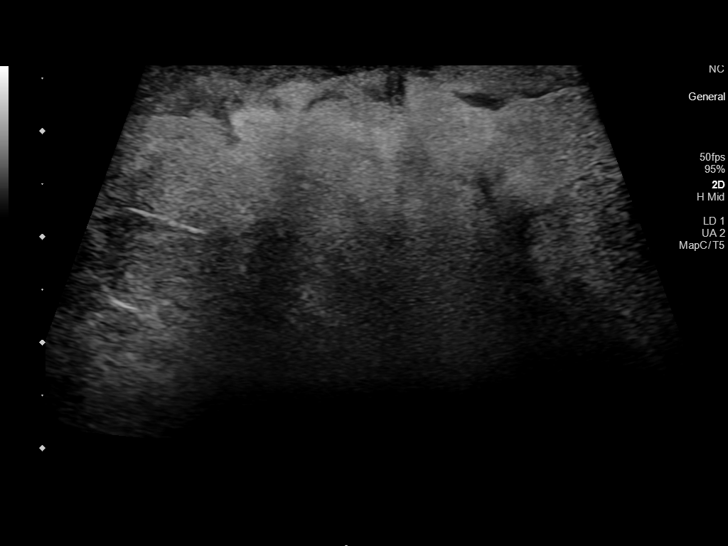
[im 6/12]
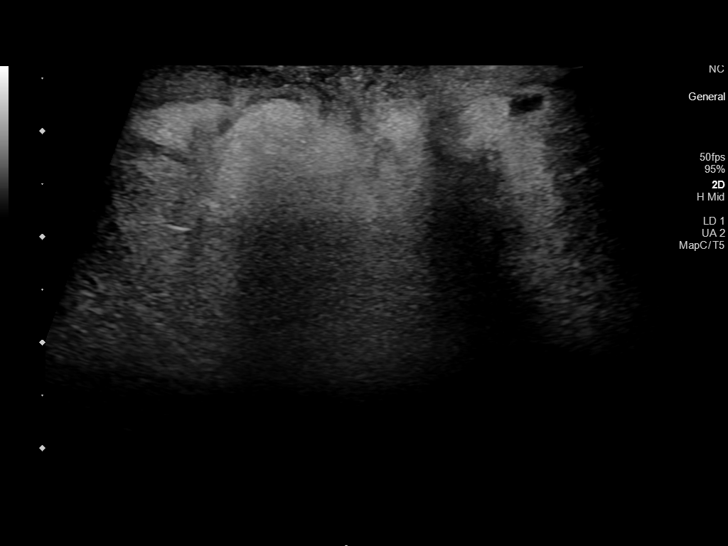
[im 7/12]
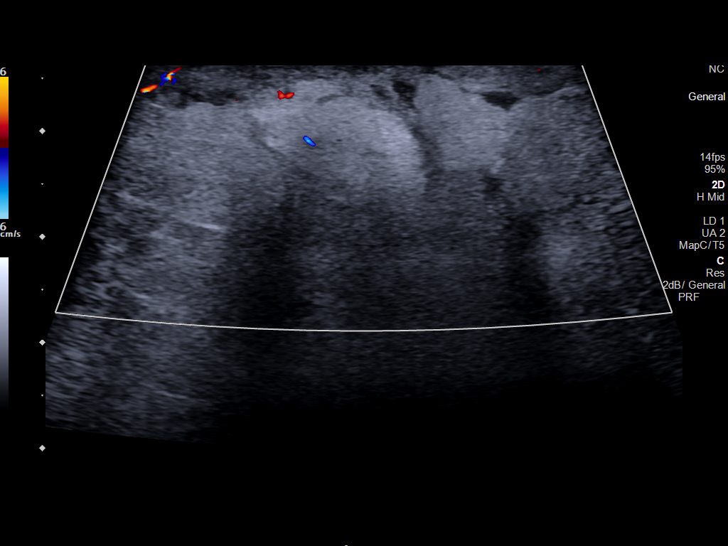
[im 8/12]
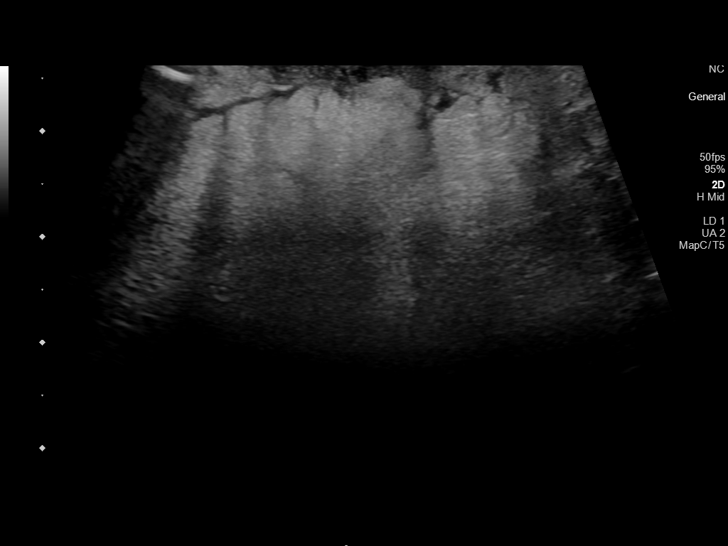
[im 9/12]
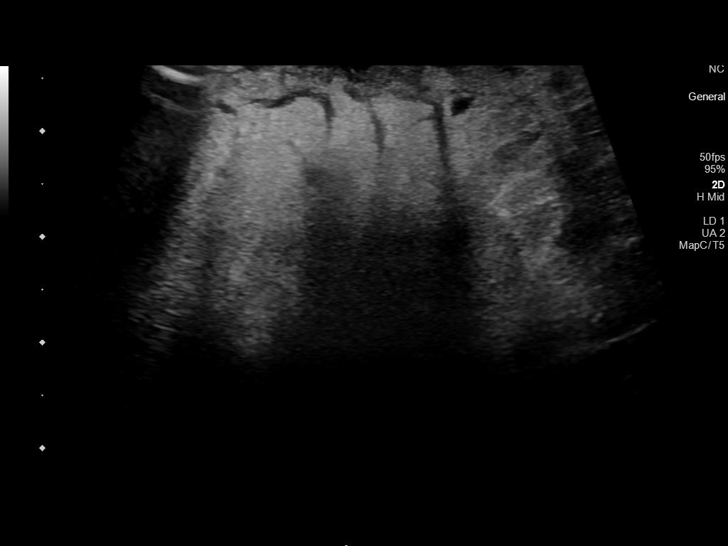
[im 10/12]
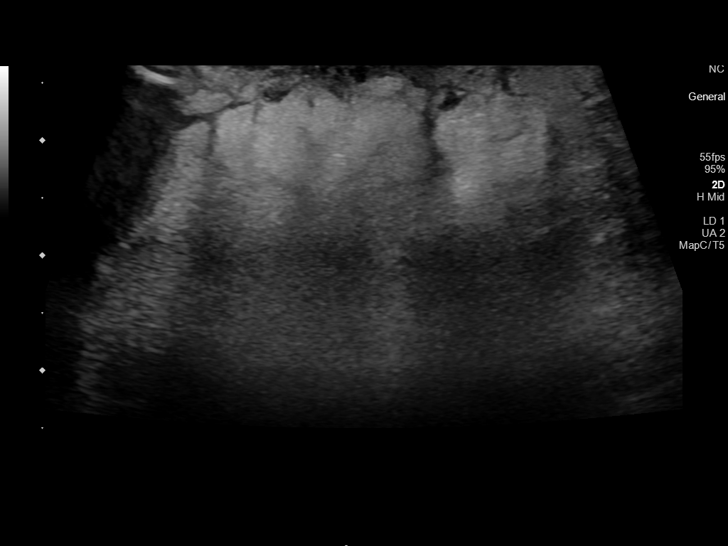
[im 11/12]
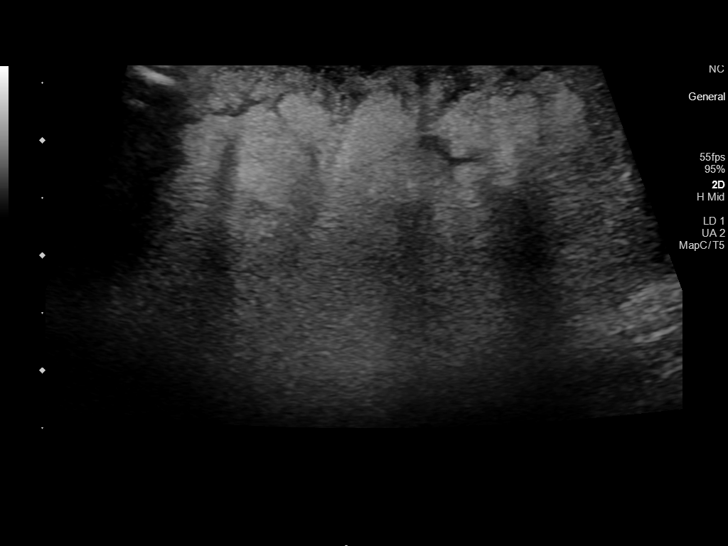
[im 12/12]
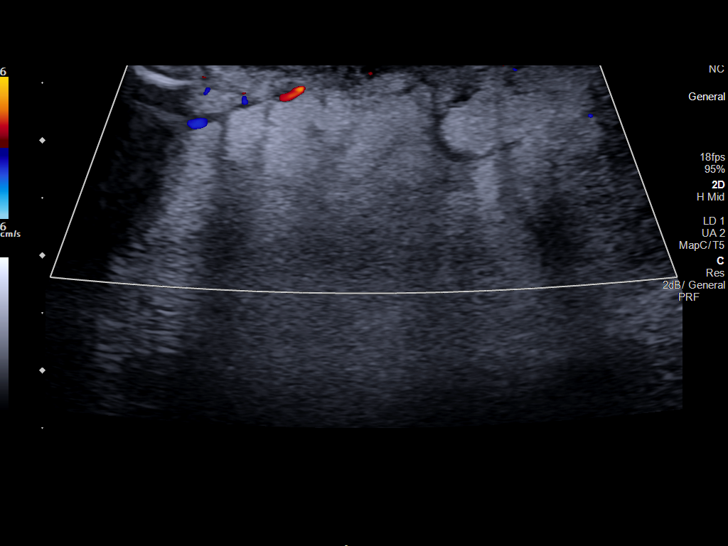

[12 of 12 positions shown; findings below may reference images not displayed]

FINDINGS: Targeted sonographic images of the left lower quadrant abdominal
wall in the area of clinical concern was performed.

There is diffuse subcutaneous edema. No drainable fluid collection
or abscess identified.
IMPRESSION: Edema.  No abscess.

## 2018-11-27 MED ORDER — INSULIN ASPART 100 UNIT/ML ~~LOC~~ SOLN
10.0000 [IU] | Freq: Three times a day (TID) | SUBCUTANEOUS | Status: DC
  Administered 2018-11-27 (×3): 10 [IU] via SUBCUTANEOUS

## 2018-11-27 MED ORDER — LIVING WELL WITH DIABETES BOOK
Freq: Once | Status: AC
  Administered 2018-11-27: 18:00:00
  Filled 2018-11-27: qty 1

## 2018-11-27 MED ORDER — INSULIN GLARGINE 100 UNIT/ML ~~LOC~~ SOLN
50.0000 [IU] | Freq: Every day | SUBCUTANEOUS | Status: DC
  Administered 2018-11-27: 50 [IU] via SUBCUTANEOUS
  Filled 2018-11-27 (×2): qty 0.5

## 2018-11-27 MED ORDER — POTASSIUM CHLORIDE CRYS ER 20 MEQ PO TBCR
30.0000 meq | EXTENDED_RELEASE_TABLET | ORAL | Status: AC
  Administered 2018-11-27 (×2): 30 meq via ORAL
  Filled 2018-11-27 (×2): qty 1

## 2018-11-27 NOTE — Progress Notes (Signed)
PROGRESS NOTE    Zachary Salazar  WUJ:[id number] DOB: 12-06-1965 DOA: 11/25/2018 PCP: Sharren Bridge, NP    Brief Narrative:  Zachary Salazar is a 53 y.o. male with medical history significant for obesity (BMI 48), poorly controlled type 2 diabetes mellitus, hypertension, depression, and OSA, presenting to the emergency department for evaluation of a lower abdominal wound with foul odor and drainage.  Patient reports that he noticed a spot on his left lower abdominal wall approximately 3 days prior to presentation, states that there has been a foul odor and purulent drainage from the site.  Patient reports that he has been putting salt and Neosporin on the wound at home.  He has not noticed any fevers.  He denies any shortness of breath, cough, or chest pain.  [facility] ED Course: Upon arrival to the ED, patient is found to be afebrile, saturating well on room air, tachycardic to 120, and with stable blood pressure.  Chemistry panel is notable for a glucose of 320 and CBC features a leukocytosis to 12,300.  Blood cultures were collected and the patient was started on vancomycin and Zosyn in the ED.  COVID-19 test negative.  Assessment & Plan:   Principal Problem:   Cellulitis of left abdominal wall Active Problems:   Uncontrolled diabetes mellitus with hyperglycemia (HCC)   Hypertension   OSA on CPAP   Left lower quadrant abdominal wound with purulence and surrounding cellulitis Patient presents with several day history of possible insect bite to his lower abdomen and lower extremities.  Complains of a draining wound to his left lower abdomen that is purulent.  On presentation had elevated white blood cell count of 12.3 and was found to be tachycardic with a heart rate of 120. --Ultrasound abdomen/soft tissue 11/27/2018 with diffuse cutaneous edema and no drainable fluid collection/abscess identified. --WBC 12.3-->9.1 --Local wound culture sent today --Continue  vancomycin, pharmacy consulted for dosing/monitoring --Check MRSA PCR --Wound care with Aquacel Ag and cover with ABD pad; change twice weekly --Hopeful for de-escalation of antibiotics next 24-48 hours to complete outpatient oral course  Insulin-dependent/type 2 diabetes mellitus, uncontrolled Hemoglobin A1c 15.8.  Has been prescribed NovoLog 70/30 60 units every morning and 50 units every afternoon; likely noncompliant. --Diabetic educator following, appreciate assistance --Lantus increased from 45u to 50u [place] this am --Novolog increased from 6 to 10u  [facility] today --Continue insulin sliding scale for further coverage --We will need further adjustments of his insulin regimen; especially in the setting of wound healing as above  Essential hypertension On hydrochlorothiazide 25 mg p.o. daily at home. --BP 132/75 this morning, controlled --Continue to hold home HCTZ, would likely benefit from ACE inhibitor for renal protection on discharge  GERD: Continue PPI  Depression: Continue Cymbalta 30 mg p.o. daily  Hypokalemia --Potassium 3.4 with magnesium 1.9 today.  Will replete. --Follow electrolytes daily  Morbid obesity BMI 48.2.  Discussed with patient need for aggressive lifestyle changes and weight loss as this complicates all facets of care.  DVT prophylaxis: SCDs, ambulation Code Status: Full code Family Communication: None Disposition Plan: Continue inpatient treatment with IV antibiotics, awaiting wound culture results, needs further titration of his insulin regimen, currently unsafe discharge given significant wound and hyperglycemia secondary to poorly controlled diabetes.   Consultants:   Diabetic coordinator  Procedures:   Ultrasound abdomen/soft tissue:  Antimicrobials:   Vancomycin 10/19>>  Zosyn 10/19 - 10/20   Subjective: Patient seen and examined at bedside, resting comfortably.  Continues to complain of  mild local tenderness surrounding her left lower  abdominal wound site.  No other specific complaints this morning.  Denies headache, no fever/chills/night sweats, no nausea/vomiting/diarrhea, no chest pain, no palpitations, no cough/congestion, no paresthesias.  No acute events overnight per nursing staff.  Objective: Vitals:   11/26/18 0550 11/26/18 1415 11/26/18 2117 11/27/18 0528  BP: (!) 148/83 (!) 184/97 (!) 152/77 132/75  Pulse: 95 88 81 79  Resp: 20 17 20 20   Temp: 98 F (36.7 C) 97.6 F (36.4 C) 97.6 F (36.4 C) 98 F (36.7 C)  TempSrc: Oral Oral Oral Oral  SpO2: 98% 98% 96% 97%  Weight:      Height:        Intake/Output Summary (Last 24 hours) at 11/27/2018 1219 Last data filed at 11/27/2018 1000 Gross per 24 hour  Intake 2169.27 ml  Output 1075 ml  Net 1094.27 ml   Filed Weights   11/25/18 1829 11/25/18 2232  Weight: (!) 155.1 kg (!) 156.7 kg    Examination:  General exam: Appears calm and comfortable, morbidly obese Respiratory system: Clear to auscultation. Respiratory effort normal. Cardiovascular system: S1 & S2 heard, RRR. No JVD, murmurs, rubs, gallops or clicks. No pedal edema. Gastrointestinal system: Abdomen is nondistended, protuberant, soft and nontender. No organomegaly or masses felt. Normal bowel sounds heard.  Noted left lower quadrant abdominal wound with dressing in place, able to express some purulent discharge with surrounding erythema/fluctuance Central nervous system: Alert and oriented. No focal neurological deficits. Extremities: Symmetric 5 x 5 power. Skin: Left lower quadrant abdominal wound with surrounding erythema/purulence/fluctuance Psychiatry: Judgement and insight appear normal. Mood & affect appropriate.     Data Reviewed: I have personally reviewed following labs and imaging studies  CBC: Recent Labs  Lab 11/25/18 1843 11/26/18 0357 11/27/18 0358  WBC 12.3* 11.3* 9.1  NEUTROABS 9.6* 7.9*  --   HGB 14.3 13.0 13.2  HCT 43.1 39.1 40.5  MCV 83.7 84.4 85.6  PLT 395 319  300   Basic Metabolic Panel: Recent Labs  Lab 11/25/18 1843 11/26/18 0357 11/26/18 0758 11/27/18 0358  NA 132* 130*  --  134*  K 3.7 3.2*  --  3.4*  CL 94* 96*  --  101  CO2 24 23  --  21*  GLUCOSE 320* 421* 449* 179*  BUN 16 22*  --  19  CREATININE 0.84 0.94  --  1.01  CALCIUM 8.7* 8.2*  --  8.4*  MG  --   --   --  1.9   GFR: Estimated Creatinine Clearance: 127.6 mL/min (by C-G formula based on SCr of 1.01 mg/dL). Liver Function Tests: Recent Labs  Lab 11/25/18 1843 11/27/18 0358  AST 18 14*  ALT 13 11  ALKPHOS 124 92  BILITOT 0.6 0.8  PROT 8.2* 6.9  ALBUMIN 3.6 3.0*   No results for input(s): LIPASE, AMYLASE in the last 168 hours. No results for input(s): AMMONIA in the last 168 hours. Coagulation Profile: No results for input(s): INR, PROTIME in the last 168 hours. Cardiac Enzymes: No results for input(s): CKTOTAL, CKMB, CKMBINDEX, TROPONINI in the last 168 hours. BNP (last 3 results) No results for input(s): PROBNP in the last 8760 hours. HbA1C: Recent Labs    11/26/18 0357  HGBA1C 15.8*   CBG: Recent Labs  Lab 11/26/18 1135 11/26/18 1649 11/26/18 2114 11/27/18 0801 11/27/18 1135  GLUCAP 84 291* 192* 192* 170*   Lipid Profile: No results for input(s): CHOL, HDL, LDLCALC, TRIG, CHOLHDL, LDLDIRECT in the  last 72 hours. Thyroid Function Tests: No results for input(s): TSH, T4TOTAL, FREET4, T3FREE, THYROIDAB in the last 72 hours. Anemia Panel: No results for input(s): VITAMINB12, FOLATE, FERRITIN, TIBC, IRON, RETICCTPCT in the last 72 hours. Sepsis Labs: Recent Labs  Lab 11/25/18 1920 11/25/18 2100  LATICACIDVEN 1.7 1.5    Recent Results (from the past 240 hour(s))  Blood culture (routine x 2)     Status: None (Preliminary result)   Collection Time: 11/25/18  8:20 PM   Specimen: BLOOD RIGHT ARM  Result Value Ref Range Status   Specimen Description   Final    BLOOD RIGHT ARM Performed at [facility], [address] [place], [place] [address]    Special Requests   Final    BOTTLES DRAWN AEROBIC AND ANAEROBIC Blood Culture adequate volume Performed at [facility], [address] [place], [place] [address]    Culture   Final    NO GROWTH 1 DAY Performed at [facility], [place] [address], [place], [place] [address]    Report Status PENDING  Incomplete  SARS CORONAVIRUS 2 (TAT 6-24 HRS) Nasopharyngeal Nasopharyngeal Swab     Status: None   Collection Time: 11/25/18  8:49 PM   Specimen: Nasopharyngeal Swab  Result Value Ref Range Status   SARS Coronavirus 2 NEGATIVE NEGATIVE Final    Comment: (NOTE) SARS-CoV-2 target nucleic acids are NOT DETECTED. The SARS-CoV-2 RNA is generally detectable in upper and lower respiratory specimens during the acute phase of infection. Negative results do not preclude SARS-CoV-2 infection, do not rule out co-infections with other pathogens, and should not be used as the sole basis for treatment or other patient management decisions. Negative results must be combined with clinical observations, patient history, and epidemiological information. The expected result is Negative. Fact Sheet for Patients: SugarRoll.be Fact Sheet for Healthcare Providers: https://www.woods-mathews.com/ This test is not yet approved or cleared by the [place] FDA and  has been authorized for detection and/or diagnosis of SARS-CoV-2 by FDA under an Emergency Use Authorization (EUA). This EUA will remain  in effect (meaning this test can be used) for the duration of the COVID-19 declaration under Section 56 4(b)(1) of the Act, 21 U.S.C. section 360bbb-3(b)(1), unless the authorization is terminated or revoked sooner. Performed at [facility], [place] [address] [place], [place] [address]   Blood culture (routine x 2)     Status: None (Preliminary result)   Collection Time: 11/25/18  9:00 PM   Specimen: BLOOD RIGHT HAND  Result  Value Ref Range Status   Specimen Description   Final    BLOOD RIGHT HAND Performed at [facility], [address] [place], [place] [address]    Special Requests   Final    BOTTLES DRAWN AEROBIC AND ANAEROBIC Blood Culture results may not be optimal due to an inadequate volume of blood received in culture bottles Performed at [facility], [address] [place], [place] [address]    Culture   Final    NO GROWTH 1 DAY Performed at [facility], [place] [address], [place], [place] [address]    Report Status PENDING  Incomplete         Radiology Studies: US Abdomen Limited  Result Date: 11/27/2018 CLINICAL DATA:  53 year old male with cellulitis. EXAM: ULTRASOUND ABDOMEN LIMITED COMPARISON:  None. FINDINGS: Targeted sonographic images of the left lower quadrant abdominal wall in the area  of clinical concern was performed. There is diffuse subcutaneous edema. No drainable fluid collection or abscess identified. IMPRESSION: Edema.  No abscess. Electronically Signed   By: Elgie Collard M.D.   On: 11/27/2018 09:43        Scheduled Meds: . DULoxetine  30 mg Oral QPC breakfast  . insulin aspart  0-20 Units Subcutaneous TID WC  . insulin aspart  0-5 Units Subcutaneous QHS  . insulin aspart  10 Units Subcutaneous TID WC  . insulin glargine  50 Units Subcutaneous Daily  . pantoprazole  40 mg Oral QPC breakfast  . potassium chloride  30 mEq Oral Q3H  . sodium chloride flush  3 mL Intravenous Q12H   Continuous Infusions: . sodium chloride    . vancomycin 1,750 mg (11/27/18 0840)     LOS: 2 days    Time spent: 38 minutes spent on chart review, discussion with nursing staff, consultants, personally reviewing all imaging studies and labs, collecting abdominal wound culture, updating family and interview/physical exam; more than 50% of that time was spent in counseling and/or coordination of care.    Alvira Philips [place], DO [facility] Pager  [phone]  If 7PM-7AM, please contact night-coverage [email or web address] Password [email or web address] 11/27/2018, 12:19 PM

## 2018-11-27 NOTE — Progress Notes (Signed)
Inpatient Diabetes Program Recommendations  AACE/ADA: New Consensus Statement on Inpatient Glycemic Control (2015)  Target Ranges:  Prepandial:   less than 140 mg/dL      Peak postprandial:   less than 180 mg/dL (1-2 hours)      Critically ill patients:  140 - 180 mg/dL   Lab Results  Component Value Date   GLUCAP 170 (H) 11/27/2018   HGBA1C 15.8 (H) 11/26/2018    Review of Glycemic Control  Spoke with pt about his glucose control and HgbA1C of 15.8%. Pt states he NEVER misses his insulin - takes 70/30 60 units in am and 40 units before supper. Checks blood sugars 4x/day. Pt states he would like to change doctors and will look into getting another one. Stressed importance of getting a little bit of exercise every day. This will help his body use his own insulin. Discussed portion control, eating less CHOs and more fiber, and eating more fresh fruits and vegetables.   Also asked pt about his injections. Pt states he injects on R abdomen most of the time. Recommended rotating sites to avoid scar tissue.  Reviewed hypoglycemia s/s and treatment.  Thank you. Lorenda Peck, RD, LDN, CDE Inpatient Diabetes Coordinator [phone]

## 2018-11-28 LAB — COMPREHENSIVE METABOLIC PANEL
ALT: 12 U/L (ref 0–44)
AST: 13 U/L — ABNORMAL LOW (ref 15–41)
Albumin: 2.9 g/dL — ABNORMAL LOW (ref 3.5–5.0)
Alkaline Phosphatase: 85 U/L (ref 38–126)
Anion gap: 8 (ref 5–15)
BUN: 18 mg/dL (ref 6–20)
CO2: 23 mmol/L (ref 22–32)
Calcium: 8.6 mg/dL — ABNORMAL LOW (ref 8.9–10.3)
Chloride: 106 mmol/L (ref 98–111)
Creatinine, Ser: 0.89 mg/dL (ref 0.61–1.24)
GFR calc Af Amer: >60 mL/min (ref >60)
GFR calc non Af Amer: >60 mL/min (ref >60)
Glucose, Bld: 167 mg/dL — ABNORMAL HIGH (ref 70–99)
Potassium: 4.3 mmol/L (ref 3.5–5.1)
Sodium: 137 mmol/L (ref 135–145)
Total Bilirubin: 0.5 mg/dL (ref 0.3–1.2)
Total Protein: 6.9 g/dL (ref 6.5–8.1)

## 2018-11-28 LAB — CBC
HCT: 39 % (ref 39.0–52.0)
Hemoglobin: 12.7 g/dL — ABNORMAL LOW (ref 13.0–17.0)
MCH: 28.6 pg (ref 26.0–34.0)
MCHC: 32.6 g/dL (ref 30.0–36.0)
MCV: 87.8 fL (ref 80.0–100.0)
Platelets: 240 10*3/uL (ref 150–400)
RBC: 4.44 MIL/uL (ref 4.22–5.81)
RDW: 13.3 % (ref 11.5–15.5)
WBC: 8.4 10*3/uL (ref 4.0–10.5)
nRBC: 0 % (ref 0.0–0.2)

## 2018-11-28 LAB — VANCOMYCIN, PEAK: Vancomycin Pk: 43 ug/mL — ABNORMAL HIGH (ref 30–40)

## 2018-11-28 LAB — MAGNESIUM: Magnesium: 1.8 mg/dL (ref 1.7–2.4)

## 2018-11-28 NOTE — Progress Notes (Signed)
Patient verbalized leaving the floor/hospital , NT, , RN and CN tried to convinced and encouraged the pt to stay and explained about the importance of his medical treatment, but patient was very consistent. Paged and notified on call provider and AC,educated he patient about AMA papers to be signed by him before leaving the hospital patient shows understanding and signed the form.

## 2018-11-29 LAB — AEROBIC CULTURE W GRAM STAIN (SUPERFICIAL SPECIMEN)

## 2018-12-01 LAB — CULTURE, BLOOD (ROUTINE X 2)
Culture: NO GROWTH
Culture: NO GROWTH
Special Requests: ADEQUATE

## 2018-12-03 NOTE — Discharge Summary (Signed)
[facility] Physician Discharge Summary  Zachary Salazar ZOX:[id number] DOB: 1965-12-09 DOA: 11/25/2018  PCP: Sharren Bridge, NP  Admit date: 11/25/2018 Discharge date: 12/03/2018  Admitted From: Home Disposition: Left AGAINST MEDICAL ADVICE  History of present illness:  Zachary Salazar a 53 y.o.malewith medical history significant forobesity (BMI 48), poorly controlled type 2 diabetes mellitus, hypertension, depression, and OSA, presenting to the emergency department for evaluation of a lower abdominal wound with foul odor and drainage. Patient reports that he noticed a spot on his left lower abdominal wall approximately 3 days prior to presentation, states that there has been a foul odor and purulent drainage from the site.Patient reports that he has been putting salt and Neosporin on the wound at home. He has not noticed any fevers. He denies any shortness of breath, cough, or chest pain.  [facility]Upon arrival to the ED, patient is found to beafebrile, saturating well on room air, tachycardic to 120, and with stable blood pressure. Chemistry panel is notable for a glucose of 320 and CBC features a leukocytosis to 12,300. Blood cultures were collected and the patient was started on vancomycin and Zosyn in the ED. COVID-19 test negative.  Hospital course:  Left lower quadrant abdominal wound with purulence and surrounding cellulitis Patient presents with several day history of possible insect bite to his lower abdomen and lower extremities.  Complains of a draining wound to his left lower abdomen that is purulent.  On presentation had elevated white blood cell count of 12.3 and was found to be tachycardic with a heart rate of 120. Ultrasound abdomen/soft tissue 11/27/2018 with diffuse cutaneous edema and no drainable fluid collection/abscess identified.  Patient was started on IV vancomycin and local wound care.  Improvement of his white count from 12.3-9.1.   Unfortunately patient left AGAINST MEDICAL ADVICE during the night.  Insulin-dependent/type 2 diabetes mellitus, uncontrolled Hemoglobin A1c 15.8.  Has been prescribed NovoLog 70/30 60 units every morning and 50 units every afternoon; likely noncompliant.  Hopefully patient will follow up with his PCP for further guidance and titration of his insulin regimen.  Essential hypertension On hydrochlorothiazide 25 mg p.o. daily at home.  Would benefit from ACE inhibitor for renal protection.  Defer to PCP now that patient left AMA.  GERD: Continue PPI  Depression: Continue Cymbalta 30 mg p.o. daily  Hypokalemia Repleted during hospitalization.  Morbid obesity BMI 48.2.  Discussed with patient need for aggressive lifestyle changes and weight loss as this complicates all facets of care.  Reasonable efforts by the nursing staff were made to advise the patient of the benefit of staying for evaluation as well as treatment. The patient had the decision-making capacity to refuse treatment at this time.  Nursing staff discussed the associated risks of leaving the hospital prior to completion of workup and treatment, and the patient voiced understanding. Alternatives to current care plan were discussed with him and patient still voiced his decision to refuse treatment. Questions were answered and discussion of need for follow-up was discussed. The AGAINST MEDICAL ADVICE paperwork was signed prior to departure from the medical floor.   Discharge Diagnoses:  Principal Problem:   Cellulitis of left abdominal wall Active Problems:   Uncontrolled diabetes mellitus with hyperglycemia (HCC)   Hypertension   OSA on CPAP    Discharge Instructions   Allergies as of 11/28/2018      Reactions   Nsaids Other (See Comments)   GI Upset (intolerance) Hx of GIB  Medication List    ASK your doctor about these medications   DULoxetine 30 MG capsule Commonly known as: CYMBALTA Take 30 mg by  mouth daily after breakfast.   hydrochlorothiazide 25 MG tablet Commonly known as: HYDRODIURIL Take 25 mg by mouth daily after breakfast.   NovoLOG Mix 70/30 (70-30) 100 UNIT/ML injection Generic drug: insulin aspart protamine- aspart Inject 50-60 Units into the skin See admin instructions. Inject 60 units every morning and 50 units every evening   pantoprazole 40 MG tablet Commonly known as: PROTONIX Take 40 mg by mouth daily after breakfast.       Allergies  Allergen Reactions  . Nsaids Other (See Comments)    GI Upset (intolerance) Hx of GIB     Consultations:  None   Procedures/Studies: Koreas Abdomen Limited  Result Date: 11/27/2018 CLINICAL DATA:  53 year old male with cellulitis. EXAM: ULTRASOUND ABDOMEN LIMITED COMPARISON:  None. FINDINGS: Targeted sonographic images of the left lower quadrant abdominal wall in the area of clinical concern was performed. There is diffuse subcutaneous edema. No drainable fluid collection or abscess identified. IMPRESSION: Edema.  No abscess. Electronically Signed   By: Elgie CollardArash  Radparvar M.D.   On: 11/27/2018 09:43      Discharge Exam: Vitals:   11/27/18 2158 11/28/18 0430  BP: (!) 168/90 (!) 180/116  Pulse: 74 100  Resp: 16 18  Temp: 98.6 F (37 C) 98.9 F (37.2 C)  SpO2: 97% 100%   Vitals:   11/27/18 1403 11/27/18 2009 11/27/18 2158 11/28/18 0430  BP: (!) 160/88  (!) 168/90 (!) 180/116  Pulse: 85 82 74 100  Resp: 18 16 16 18   Temp: 98.7 F (37.1 C)  98.6 F (37 C) 98.9 F (37.2 C)  TempSrc: Oral  Oral Oral  SpO2: 97% 98% 97% 100%  Weight:      Height:          The results of significant diagnostics from this hospitalization (including imaging, microbiology, ancillary and laboratory) are listed below for reference.     Microbiology: Recent Results (from the past 240 hour(s))  Blood culture (routine x 2)     Status: None   Collection Time: 11/25/18  8:20 PM   Specimen: BLOOD RIGHT ARM  Result Value Ref Range  Status   Specimen Description   Final    BLOOD RIGHT ARM Performed at [facility], [address] [place], [place] [address]    Special Requests   Final    BOTTLES DRAWN AEROBIC AND ANAEROBIC Blood Culture adequate volume Performed at [facility], [address] [place], [place] [address]    Culture   Final    NO GROWTH 5 DAYS Performed at [facility], [address] [place], [place] [address]    Report Status 12/01/2018 FINAL  Final  SARS CORONAVIRUS 2 (TAT 6-24 HRS) Nasopharyngeal Nasopharyngeal Swab     Status: None   Collection Time: 11/25/18  8:49 PM   Specimen: Nasopharyngeal Swab  Result Value Ref Range Status   SARS Coronavirus 2 NEGATIVE NEGATIVE Final    Comment: (NOTE) SARS-CoV-2 target nucleic acids are NOT DETECTED. The SARS-CoV-2 RNA is generally detectable in upper and lower respiratory specimens during the acute phase of infection. Negative results do not preclude SARS-CoV-2 infection, do not rule out co-infections with other pathogens, and should not be used as the sole basis for treatment or other patient management decisions. Negative results must be combined with clinical observations, patient  history, and epidemiological information. The expected result is Negative. Fact Sheet for Patients: HairSlick.no Fact Sheet for Healthcare Providers: quierodirigir.com This test is not yet approved or cleared by the [place] FDA and  has been authorized for detection and/or diagnosis of SARS-CoV-2 by FDA under an Emergency Use Authorization (EUA). This EUA will remain  in effect (meaning this test can be used) for the duration of the COVID-19 declaration under Section 56 4(b)(1) of the Act, 21 U.S.C. section 360bbb-3(b)(1), unless the authorization is terminated or revoked sooner. Performed at [facility], [address] [place], [place] [address]   Blood culture  (routine x 2)     Status: None   Collection Time: 11/25/18  9:00 PM   Specimen: BLOOD RIGHT HAND  Result Value Ref Range Status   Specimen Description   Final    BLOOD RIGHT HAND Performed at [facility], [address] [place], [place] [address]    Special Requests   Final    BOTTLES DRAWN AEROBIC AND ANAEROBIC Blood Culture results may not be optimal due to an inadequate volume of blood received in culture bottles Performed at [facility], [address] [place], [place] [address]    Culture   Final    NO GROWTH 5 DAYS Performed at [facility], [address], [place], [place] [address]    Report Status 12/01/2018 FINAL  Final  Aerobic Culture (superficial specimen)     Status: None   Collection Time: 11/27/18  8:51 AM   Specimen: Wound  Result Value Ref Range Status   Specimen Description   Final    WOUND LT FLANK AREA Performed at [facility], [address] [place], [place] [address]    Special Requests   Final    NONE Performed at [facility], [address], [place], [place] [address]    Gram Stain   Final    RARE WBC PRESENT, PREDOMINANTLY PMN FEW GRAM POSITIVE COCCI IN PAIRS    Culture   Final    MODERATE STAPHYLOCOCCUS AUREUS MODERATE GROUP B STREP(S.AGALACTIAE)ISOLATED TESTING AGAINST S. AGALACTIAE NOT ROUTINELY PERFORMED DUE TO PREDICTABILITY OF AMP/PEN/VAN SUSCEPTIBILITY. Performed at [facility], [address], [place], [place] [address]    Report Status 11/29/2018 FINAL  Final   Organism ID, Bacteria STAPHYLOCOCCUS AUREUS  Final      Susceptibility   Staphylococcus aureus - MIC*    CIPROFLOXACIN >=8 RESISTANT Resistant     ERYTHROMYCIN >=8 RESISTANT Resistant     GENTAMICIN <=0.5 SENSITIVE Sensitive     OXACILLIN 0.5 SENSITIVE Sensitive     TETRACYCLINE <=1 SENSITIVE Sensitive     VANCOMYCIN 1 SENSITIVE Sensitive     TRIMETH/SULFA <=10 SENSITIVE Sensitive     CLINDAMYCIN  <=0.25 SENSITIVE Sensitive     RIFAMPIN <=0.5 SENSITIVE Sensitive     Inducible Clindamycin NEGATIVE Sensitive     * MODERATE STAPHYLOCOCCUS AUREUS  MRSA PCR Screening     Status: None   Collection Time: 11/27/18  4:35 PM   Specimen: Nasal Mucosa; Nasopharyngeal  Result Value Ref Range Status   MRSA by PCR NEGATIVE NEGATIVE Final    Comment:        The GeneXpert MRSA Assay (FDA approved for NASAL specimens only), is one component of a comprehensive MRSA colonization surveillance program. It is not intended to diagnose MRSA infection nor to guide or monitor treatment for MRSA infections. Performed  at [facility], [address], [place], [place] [address]      Labs: BNP (last 3 results) No results for input(s): BNP in the last 8760 hours. Basic Metabolic Panel: Recent Labs  Lab 11/27/18 0358 11/28/18 0036  NA 134* 137  K 3.4* 4.3  CL 101 106  CO2 21* 23  GLUCOSE 179* 167*  BUN 19 18  CREATININE 1.01 0.89  CALCIUM 8.4* 8.6*  MG 1.9 1.8   Liver Function Tests: Recent Labs  Lab 11/27/18 0358 11/28/18 0036  AST 14* 13*  ALT 11 12  ALKPHOS 92 85  BILITOT 0.8 0.5  PROT 6.9 6.9  ALBUMIN 3.0* 2.9*   No results for input(s): LIPASE, AMYLASE in the last 168 hours. No results for input(s): AMMONIA in the last 168 hours. CBC: Recent Labs  Lab 11/27/18 0358 11/28/18 0036  WBC 9.1 8.4  HGB 13.2 12.7*  HCT 40.5 39.0  MCV 85.6 87.8  PLT 300 240   Cardiac Enzymes: No results for input(s): CKTOTAL, CKMB, CKMBINDEX, TROPONINI in the last 168 hours. BNP: Invalid input(s): POCBNP CBG: Recent Labs  Lab 11/26/18 2114 11/27/18 0801 11/27/18 1135 11/27/18 1634 11/27/18 2210  GLUCAP 192* 192* 170* 130* 158*   D-Dimer No results for input(s): DDIMER in the last 72 hours. Hgb A1c No results for input(s): HGBA1C in the last 72 hours. Lipid Profile No results for input(s): CHOL, HDL, LDLCALC, TRIG, CHOLHDL, LDLDIRECT in the last 72  hours. Thyroid function studies No results for input(s): TSH, T4TOTAL, T3FREE, THYROIDAB in the last 72 hours.  Invalid input(s): FREET3 Anemia work up No results for input(s): VITAMINB12, FOLATE, FERRITIN, TIBC, IRON, RETICCTPCT in the last 72 hours. Urinalysis    Component Value Date/Time   COLORURINE YELLOW 11/25/2018 1843   APPEARANCEUR CLEAR 11/25/2018 1843   LABSPEC 1.025 11/25/2018 1843   PHURINE 6.0 11/25/2018 1843   GLUCOSEU >=500 (A) 11/25/2018 1843   HGBUR NEGATIVE 11/25/2018 1843   BILIRUBINUR SMALL (A) 11/25/2018 1843   KETONESUR 15 (A) 11/25/2018 1843   PROTEINUR NEGATIVE 11/25/2018 1843   NITRITE NEGATIVE 11/25/2018 1843   LEUKOCYTESUR NEGATIVE 11/25/2018 1843   Sepsis Labs Invalid input(s): PROCALCITONIN,  WBC,  LACTICIDVEN Microbiology Recent Results (from the past 240 hour(s))  Blood culture (routine x 2)     Status: None   Collection Time: 11/25/18  8:20 PM   Specimen: BLOOD RIGHT ARM  Result Value Ref Range Status   Specimen Description   Final    BLOOD RIGHT ARM Performed at [facility], [address] [place], [place] [address]    Special Requests   Final    BOTTLES DRAWN AEROBIC AND ANAEROBIC Blood Culture adequate volume Performed at [facility], [address] [place], [place] [address]    Culture   Final    NO GROWTH 5 DAYS Performed at [facility], [address] [place], [place] [address]    Report Status 12/01/2018 FINAL  Final  SARS CORONAVIRUS 2 (TAT 6-24 HRS) Nasopharyngeal Nasopharyngeal Swab     Status: None   Collection Time: 11/25/18  8:49 PM   Specimen: Nasopharyngeal Swab  Result Value Ref Range Status   SARS Coronavirus 2 NEGATIVE NEGATIVE Final    Comment: (NOTE) SARS-CoV-2 target nucleic acids are NOT DETECTED. The SARS-CoV-2 RNA is generally detectable in upper and lower respiratory specimens during the acute phase of infection. Negative results do not preclude SARS-CoV-2 infection, do not  rule  out co-infections with other pathogens, and should not be used as the sole basis for treatment or other patient management decisions. Negative results must be combined with clinical observations, patient history, and epidemiological information. The expected result is Negative. Fact Sheet for Patients: HairSlick.no Fact Sheet for Healthcare Providers: quierodirigir.com This test is not yet approved or cleared by the [place] FDA and  has been authorized for detection and/or diagnosis of SARS-CoV-2 by FDA under an Emergency Use Authorization (EUA). This EUA will remain  in effect (meaning this test can be used) for the duration of the COVID-19 declaration under Section 56 4(b)(1) of the Act, 21 U.S.C. section 360bbb-3(b)(1), unless the authorization is terminated or revoked sooner. Performed at [facility], [address] [place], [place] [address]   Blood culture (routine x 2)     Status: None   Collection Time: 11/25/18  9:00 PM   Specimen: BLOOD RIGHT HAND  Result Value Ref Range Status   Specimen Description   Final    BLOOD RIGHT HAND Performed at [facility], [address] [place], [place] [address]    Special Requests   Final    BOTTLES DRAWN AEROBIC AND ANAEROBIC Blood Culture results may not be optimal due to an inadequate volume of blood received in culture bottles Performed at [facility], [address] [place], [place] [address]    Culture   Final    NO GROWTH 5 DAYS Performed at [facility], [address], [place], [place] [address]    Report Status 12/01/2018 FINAL  Final  Aerobic Culture (superficial specimen)     Status: None   Collection Time: 11/27/18  8:51 AM   Specimen: Wound  Result Value Ref Range Status   Specimen Description   Final    WOUND LT FLANK AREA Performed at [facility], [address], [place], [place] [address]     Special Requests   Final    NONE Performed at [facility], [address] [place], [place] [address]    Gram Stain   Final    RARE WBC PRESENT, PREDOMINANTLY PMN FEW GRAM POSITIVE COCCI IN PAIRS    Culture   Final    MODERATE STAPHYLOCOCCUS AUREUS MODERATE GROUP B STREP(S.AGALACTIAE)ISOLATED TESTING AGAINST S. AGALACTIAE NOT ROUTINELY PERFORMED DUE TO PREDICTABILITY OF AMP/PEN/VAN SUSCEPTIBILITY. Performed at [facility], [address], [place], [place] [address]    Report Status 11/29/2018 FINAL  Final   Organism ID, Bacteria STAPHYLOCOCCUS AUREUS  Final      Susceptibility   Staphylococcus aureus - MIC*    CIPROFLOXACIN >=8 RESISTANT Resistant     ERYTHROMYCIN >=8 RESISTANT Resistant     GENTAMICIN <=0.5 SENSITIVE Sensitive     OXACILLIN 0.5 SENSITIVE Sensitive     TETRACYCLINE <=1 SENSITIVE Sensitive     VANCOMYCIN 1 SENSITIVE Sensitive     TRIMETH/SULFA <=10 SENSITIVE Sensitive     CLINDAMYCIN <=0.25 SENSITIVE Sensitive     RIFAMPIN <=0.5 SENSITIVE Sensitive     Inducible Clindamycin NEGATIVE Sensitive     * MODERATE STAPHYLOCOCCUS AUREUS  MRSA PCR Screening     Status: None   Collection Time: 11/27/18  4:35 PM   Specimen: Nasal Mucosa; Nasopharyngeal  Result Value Ref Range Status   MRSA by PCR NEGATIVE NEGATIVE Final    Comment:        The GeneXpert MRSA Assay (FDA approved for NASAL specimens  only), is one component of a comprehensive MRSA colonization surveillance program. It is not intended to diagnose MRSA infection nor to guide or monitor treatment for MRSA infections. Performed at [facility], [place] [address] [place], [place] [address]      Time coordinating discharge: Over 30 minutes  SIGNED:   Kayton Dunaj J [place] DO [facility] 12/03/2018, 5:40 PM

## 2021-04-05 ENCOUNTER — Emergency Department (HOSPITAL_COMMUNITY): Payer: Medicare Other

## 2021-04-05 ENCOUNTER — Emergency Department (HOSPITAL_COMMUNITY)
Admission: EM | Admit: 2021-04-05 | Discharge: 2021-04-05 | Disposition: A | Discharge status: Home / Self Care | Payer: Medicare Other | Attending: Emergency Medicine | Admitting: Emergency Medicine

## 2021-04-05 ENCOUNTER — Encounter (HOSPITAL_COMMUNITY): Payer: Self-pay | Admitting: Emergency Medicine

## 2021-04-05 ENCOUNTER — Other Ambulatory Visit: Payer: Self-pay

## 2021-04-05 DIAGNOSIS — H4710 Unspecified papilledema: Secondary | ICD-10-CM | POA: Insufficient documentation

## 2021-04-05 DIAGNOSIS — F40240 Claustrophobia: Secondary | ICD-10-CM | POA: Insufficient documentation

## 2021-04-05 DIAGNOSIS — H538 Other visual disturbances: Secondary | ICD-10-CM | POA: Insufficient documentation

## 2021-04-05 LAB — CBC WITH DIFFERENTIAL/PLATELET
Abs Immature Granulocytes: 0.04 10*3/uL (ref 0.00–0.07)
Basophils Absolute: 0.1 10*3/uL (ref 0.0–0.1)
Basophils Relative: 1 %
Eosinophils Absolute: 0.2 10*3/uL (ref 0.0–0.5)
Eosinophils Relative: 2 %
HCT: 38.8 % — ABNORMAL LOW (ref 39.0–52.0)
Hemoglobin: 12.7 g/dL — ABNORMAL LOW (ref 13.0–17.0)
Immature Granulocytes: 0 %
Lymphocytes Relative: 15 %
Lymphs Abs: 1.4 10*3/uL (ref 0.7–4.0)
MCH: 27.4 pg (ref 26.0–34.0)
MCHC: 32.7 g/dL (ref 30.0–36.0)
MCV: 83.8 fL (ref 80.0–100.0)
Monocytes Absolute: 0.6 10*3/uL (ref 0.1–1.0)
Monocytes Relative: 6 %
Neutro Abs: 6.9 10*3/uL (ref 1.7–7.7)
Neutrophils Relative %: 76 %
Platelets: 272 10*3/uL (ref 150–400)
RBC: 4.63 MIL/uL (ref 4.22–5.81)
RDW: 14.2 % (ref 11.5–15.5)
WBC: 9.1 10*3/uL (ref 4.0–10.5)
nRBC: 0 % (ref 0.0–0.2)

## 2021-04-05 LAB — BASIC METABOLIC PANEL
Anion gap: 9 (ref 5–15)
BUN: 19 mg/dL (ref 6–20)
CO2: 24 mmol/L (ref 22–32)
Calcium: 9 mg/dL (ref 8.9–10.3)
Chloride: 107 mmol/L (ref 98–111)
Creatinine, Ser: 0.96 mg/dL (ref 0.61–1.24)
GFR, Estimated: >60 mL/min (ref >60)
Glucose, Bld: 94 mg/dL (ref 70–99)
Potassium: 4.1 mmol/L (ref 3.5–5.1)
Sodium: 140 mmol/L (ref 135–145)

## 2021-04-05 IMAGING — MR MR HEAD WO/W CM
13 of 15 series · 40 of 48 positions shown · IV contrast (gadavist)
Comparison: None.

CLINICAL DATA: Right eye blurry vision

EXAM:
MRI HEAD AND ORBITS WITHOUT AND WITH CONTRAST
TECHNIQUE: Multiplanar, multiecho pulse sequences of the brain and surrounding
structures were obtained without and with intravenous contrast.
Multiplanar, multiecho pulse sequences of the orbits and surrounding
structures were obtained including fat saturation techniques, before
and after intravenous contrast administration.
CONTRAST:  10mL GADAVIST GADOBUTROL 1 MMOL/ML IV SOLN

[Series 5: DWI · axial · 3.0mm · 0.88mm/px · z∈[-83,+63]mm · 6 of 100 slices shown (1 of 4)]
[im 1/100]
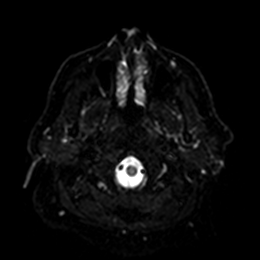
[im 20/100]
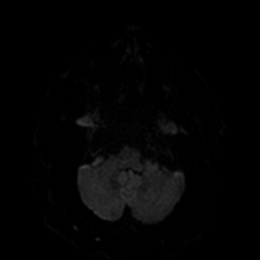
[im 40/100]
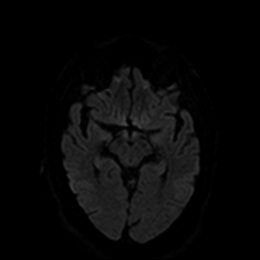
[im 60/100]
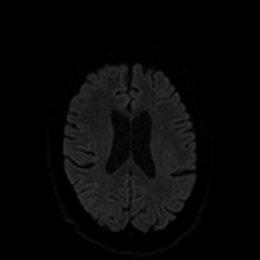
[im 80/100]
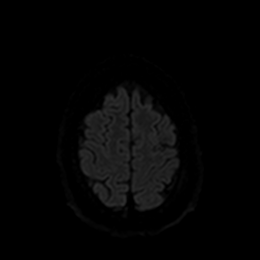
[im 100/100]
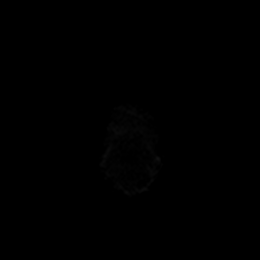

[Series 6: DWI · axial · 3.0mm · 0.88mm/px · z∈[-83,+63]mm · 3 of 50 slices shown (2 of 4)]
[im 1/50]
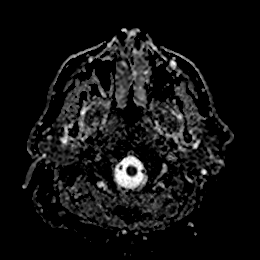
[im 25/50]
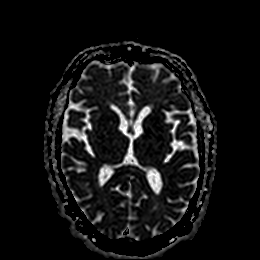
[im 50/50]
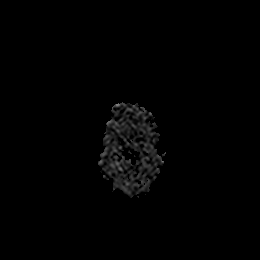

[Series 7: DWI · coronal · 4.0mm · 0.88mm/px · 4 of 68 slices shown (3 of 4)]
[im 1/68]
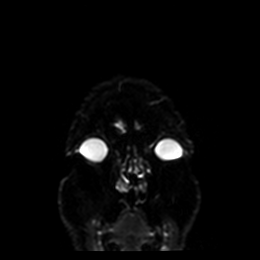
[im 23/68]
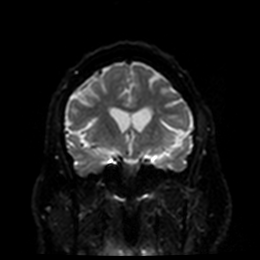
[im 45/68]
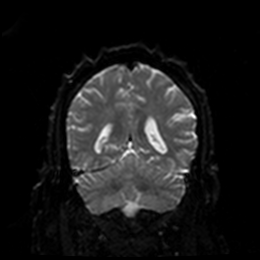
[im 68/68]
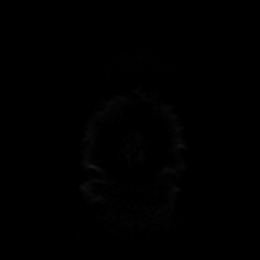

[Series 8: DWI · coronal · 4.0mm · 0.88mm/px · 2 of 34 slices shown (4 of 4)]
[im 1/34]
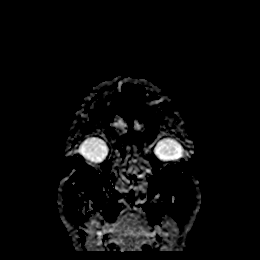
[im 34/34]
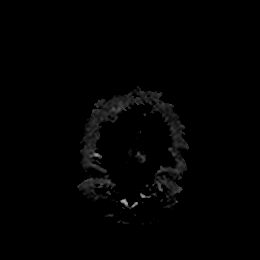

[Series 9: T1 · sagittal · 5.0mm · 0.75mm/px · 2 of 23 slices shown]
[im 1/23]
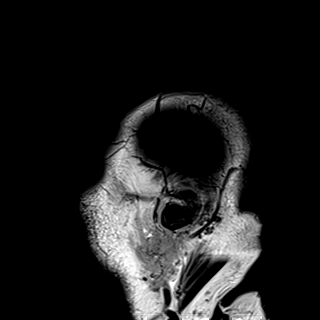
[im 23/23]
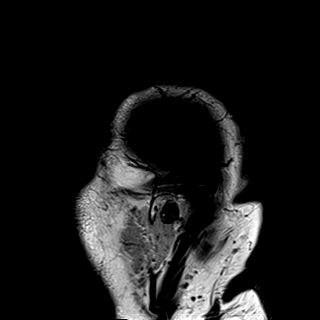

[Series 10: T2 · axial · 5.0mm · 0.72mm/px · z∈[-82,+62]mm · 2 of 25 slices shown]
[im 1/25]
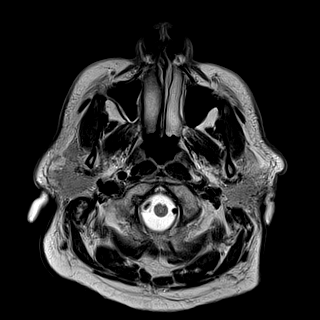
[im 25/25]
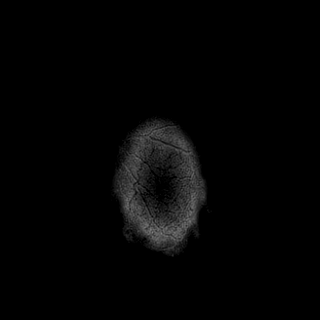

[Series 11: FLAIR · axial · 5.0mm · 0.45mm/px · z∈[-83,+61]mm · 2 of 25 slices shown]
[im 1/25]
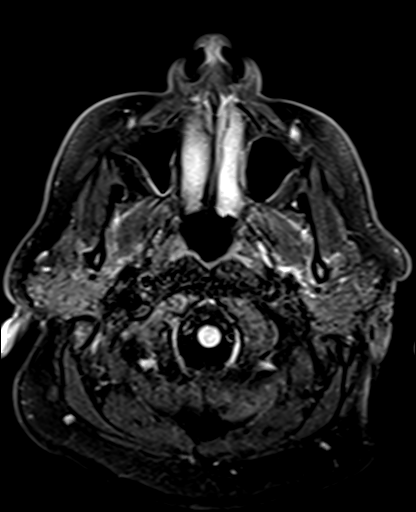
[im 25/25]
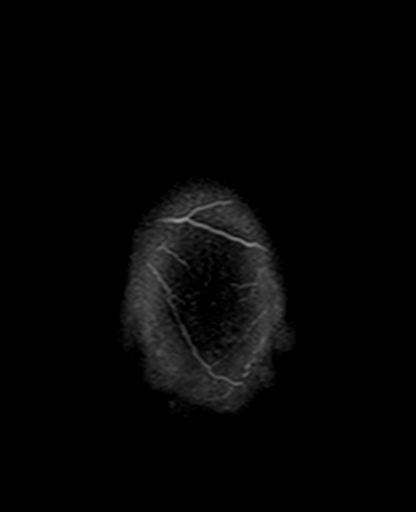

[Series 12: mag_images · axial · 3.0mm · 0.90mm/px · z∈[-87,+65]mm · 4 of 52 slices shown]
[im 1/52]
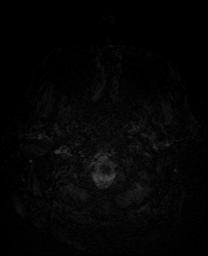
[im 18/52]
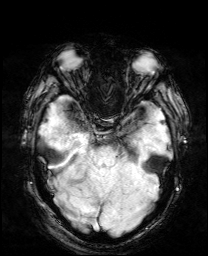
[im 35/52]
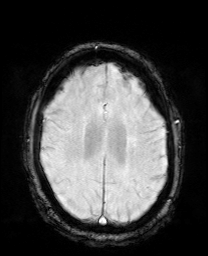
[im 52/52]
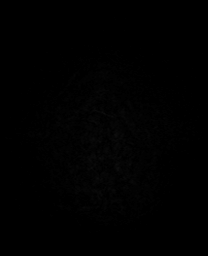

[Series 13: pha_images · axial · 3.0mm · 0.90mm/px · z∈[-87,+62]mm · 4 of 51 slices shown]
[im 1/51]
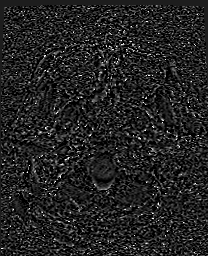
[im 17/51]
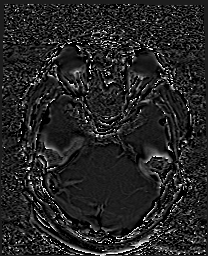
[im 34/51]
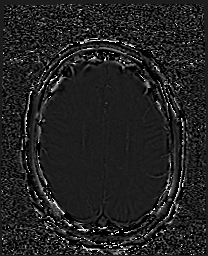
[im 51/51]
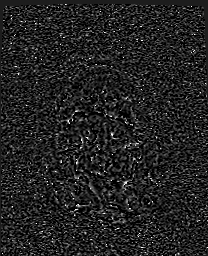

[Series 14: swi_images · axial · 3.0mm · 0.90mm/px · z∈[-87,+65]mm · 4 of 52 slices shown]
[im 1/52]
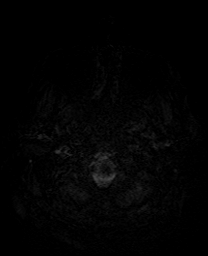
[im 18/52]
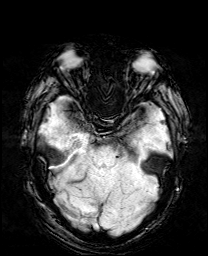
[im 35/52]
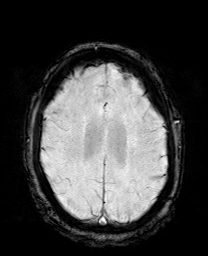
[im 52/52]
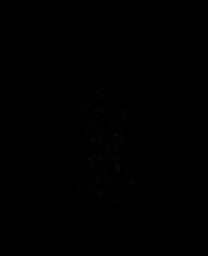

[Series 15: mip_images(sw) · axial · 24.0mm · 0.90mm/px · z∈[-77,+55]mm · 3 of 45 slices shown]
[im 1/45]
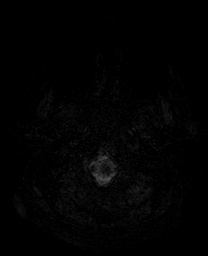
[im 23/45]
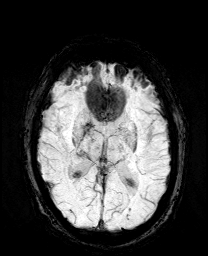
[im 45/45]
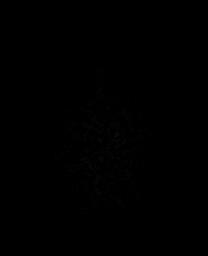

[Series 17: T2 post-contrast · coronal · 5.0mm · 0.72mm/px · 2 of 28 slices shown]
[im 1/28]
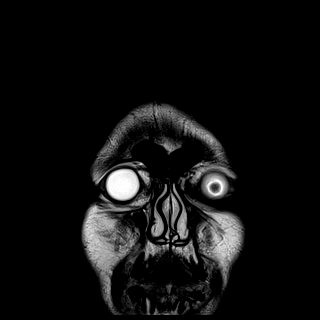
[im 28/28]
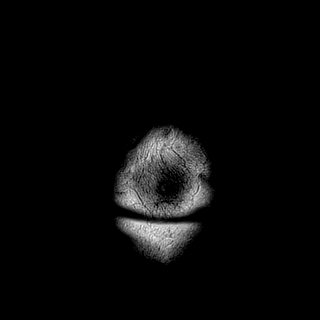

[Series 19: T1 post-contrast · coronal · 5.0mm · 0.34mm/px · 2 of 28 slices shown]
[im 1/28]
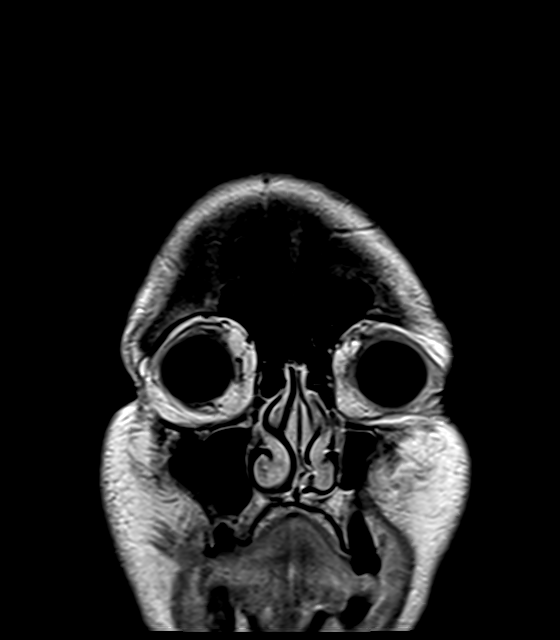
[im 28/28]
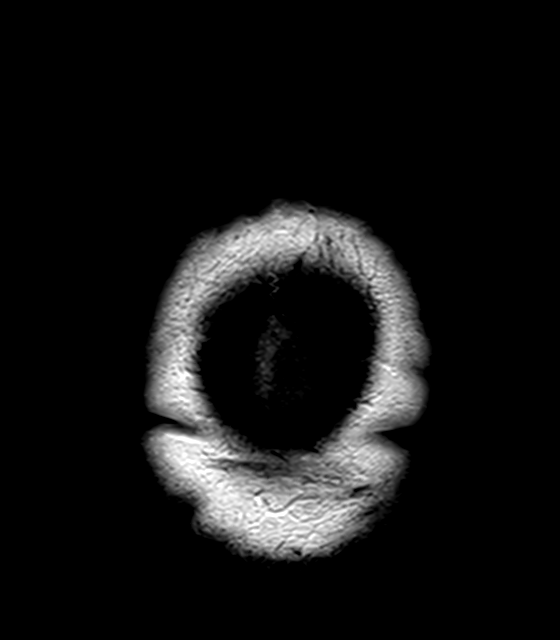

[40 of 48 positions shown; findings below may reference images not displayed]

FINDINGS: MRI HEAD FINDINGS

Brain: No acute infarct, mass effect or extra-axial collection. No
acute or chronic hemorrhage. Normal white matter signal, parenchymal
volume and CSF spaces. The midline structures are normal. Old small
vessel infarct of the left corona radiata. Small amount of
associated chronic blood products.

Vascular: Major flow voids are preserved.

Skull and upper cervical spine: Normal calvarium and skull base.
Visualized upper cervical spine and soft tissues are normal.

Sinuses/Orbits:No paranasal sinus fluid levels or advanced mucosal
thickening. No mastoid or middle ear effusion. Normal orbits.

MRI ORBITS FINDINGS

Orbits:

--Globes: Normal.

--Bony orbit: Normal.

--Preseptal soft tissues: Normal.

--Intra- and extraconal orbital fat: Normal. No inflammatory
stranding.

--Optic nerves: Normal.

--Lacrimal glands and fossae: Normal.

--Extraocular muscles: Normal.

Visualized sinuses:  No fluid levels or advanced mucosal thickening.

Soft tissues: Normal.

Limited intracranial: Normal.
IMPRESSION: 1. No acute intracranial abnormality.
2. Old small vessel infarct of the left corona radiata.
3. Normal MRI of the orbits.

## 2021-04-05 IMAGING — MR MR ORBITS WO/W CM
8 of 10 series · 33 of 48 positions shown · IV contrast (gadavist)
Comparison: None.

CLINICAL DATA: Right eye blurry vision

EXAM:
MRI HEAD AND ORBITS WITHOUT AND WITH CONTRAST
TECHNIQUE: Multiplanar, multiecho pulse sequences of the brain and surrounding
structures were obtained without and with intravenous contrast.
Multiplanar, multiecho pulse sequences of the orbits and surrounding
structures were obtained including fat saturation techniques, before
and after intravenous contrast administration.
CONTRAST:  10mL GADAVIST GADOBUTROL 1 MMOL/ML IV SOLN

[Series 5: T1 · axial · non-contrast · 3.0mm · 0.37mm/px · z∈[-71,-12]mm · 3 of 15 slices shown (1 of 2)]
[im 1/15]
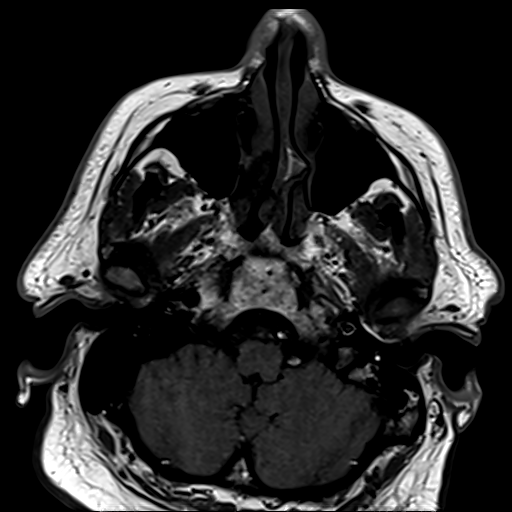
[im 8/15]
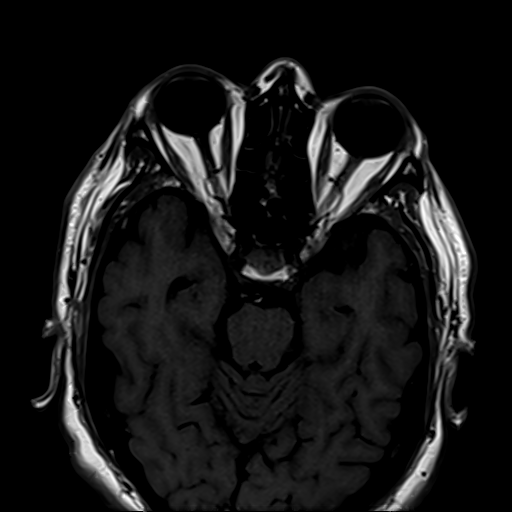
[im 15/15]
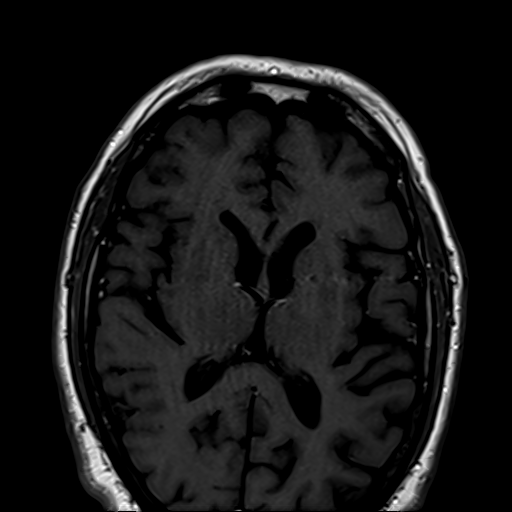

[Series 6: T2 fat-sat · axial · 3.0mm · 0.54mm/px · z∈[-71,-12]mm · 3 of 15 slices shown (1 of 6)]
[im 1/15]
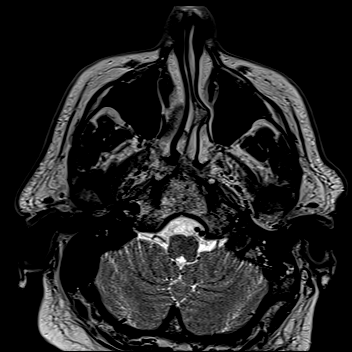
[im 8/15]
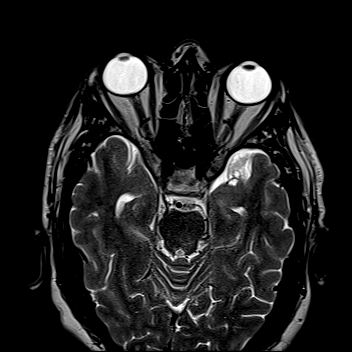
[im 15/15]
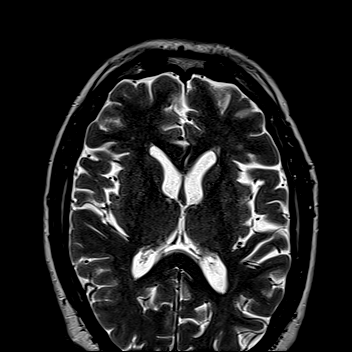

[Series 7: T2 fat-sat · axial · 3.0mm · 0.54mm/px · z∈[-71,-12]mm · 4 of 15 slices shown (2 of 6)]
[im 1/15]
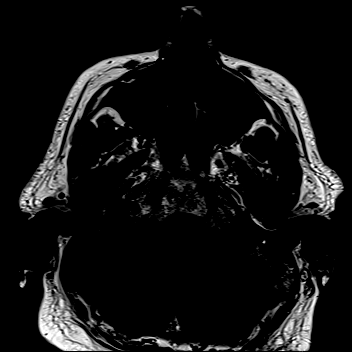
[im 5/15]
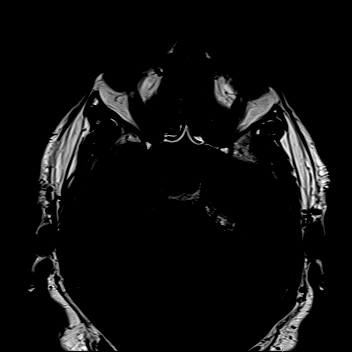
[im 10/15]
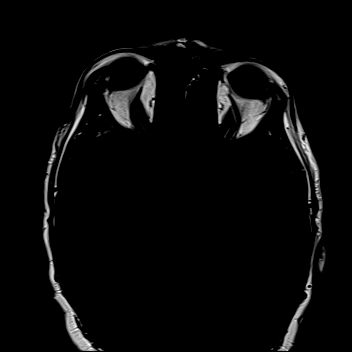
[im 15/15]
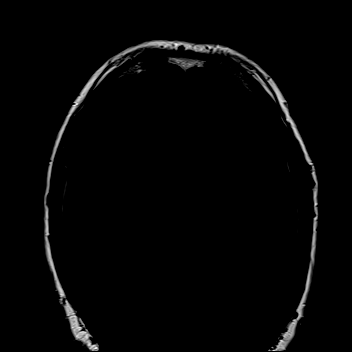

[Series 8: T2 fat-sat · axial · 3.0mm · 0.54mm/px · z∈[-71,-12]mm · 4 of 15 slices shown (3 of 6)]
[im 1/15]
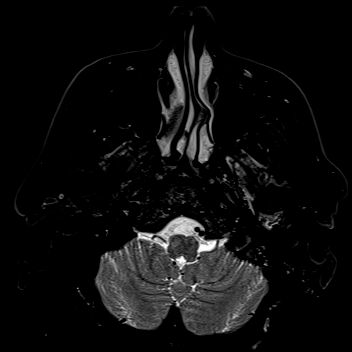
[im 5/15]
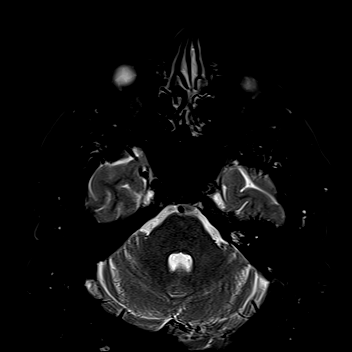
[im 10/15]
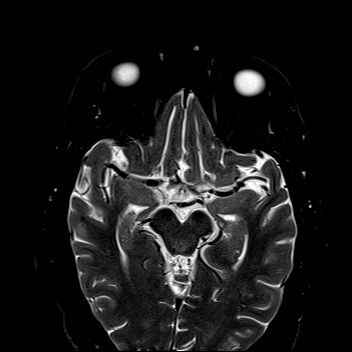
[im 15/15]
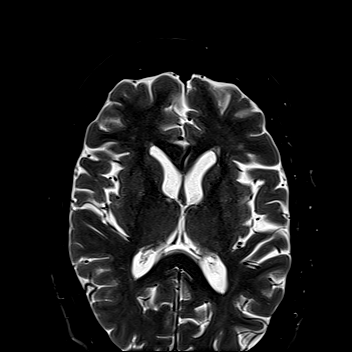

[Series 9: T2 fat-sat · coronal · 3.0mm · 0.54mm/px · 6 of 25 slices shown (4 of 6)]
[im 1/25]
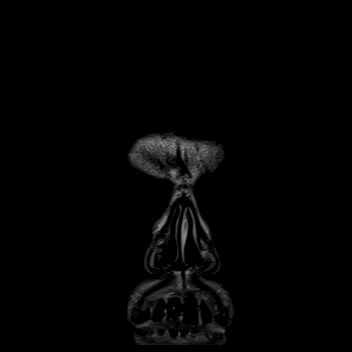
[im 5/25]
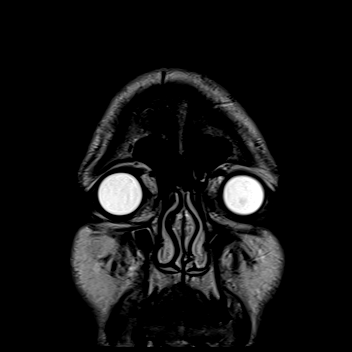
[im 10/25]
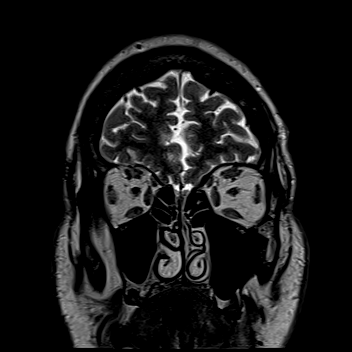
[im 15/25]
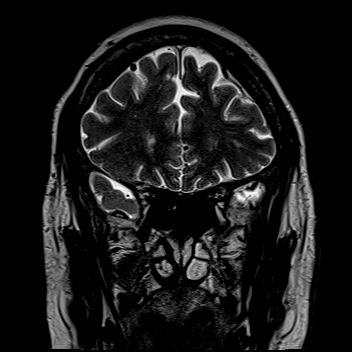
[im 20/25]
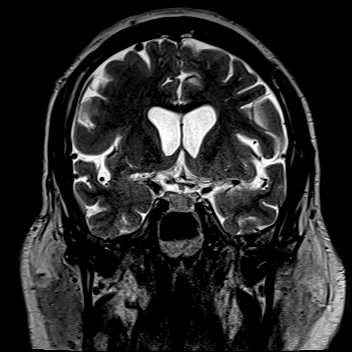
[im 25/25]
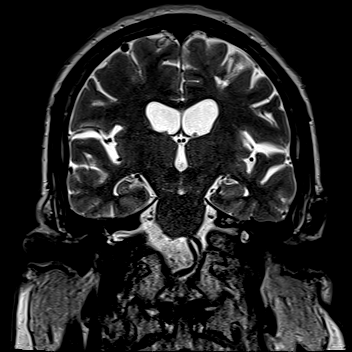

[Series 10: T2 fat-sat · coronal · 3.0mm · 0.54mm/px · 6 of 25 slices shown (5 of 6)]
[im 1/25]
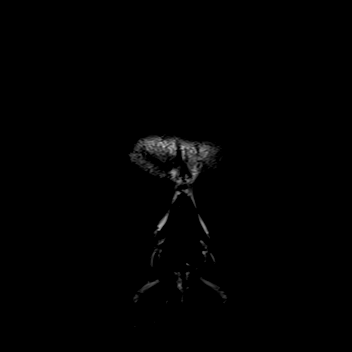
[im 5/25]
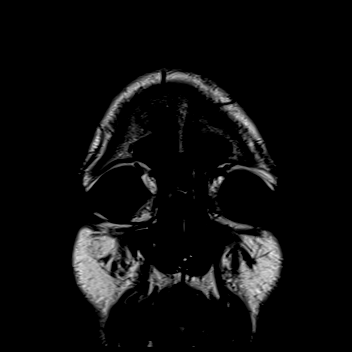
[im 10/25]
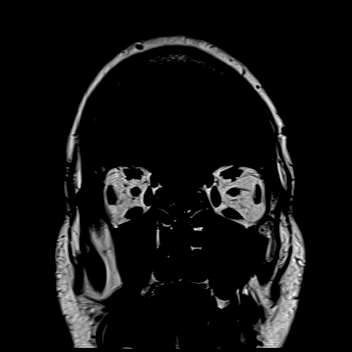
[im 15/25]
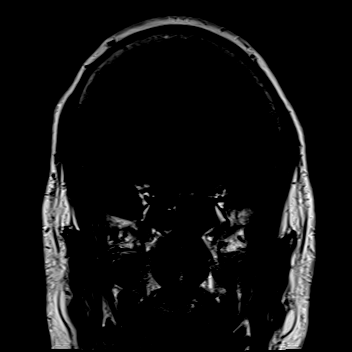
[im 20/25]
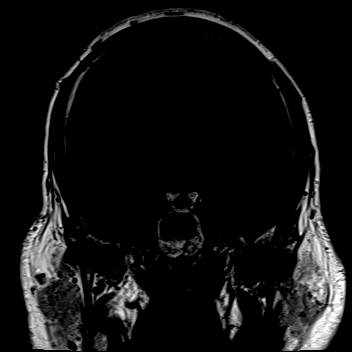
[im 25/25]
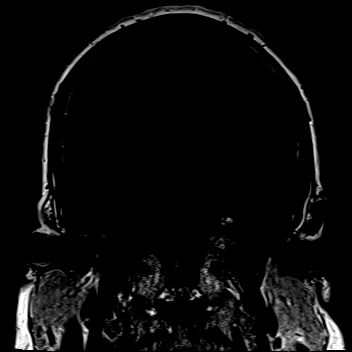

[Series 11: T2 fat-sat · coronal · 3.0mm · 0.54mm/px · 6 of 25 slices shown (6 of 6)]
[im 1/25]
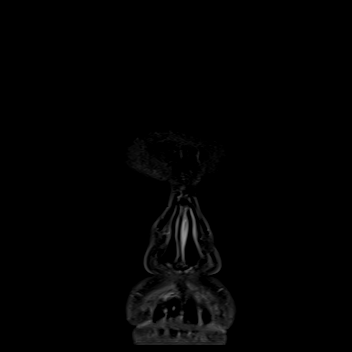
[im 5/25]
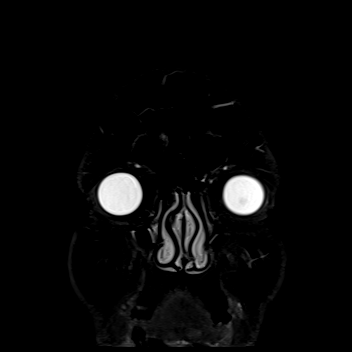
[im 10/25]
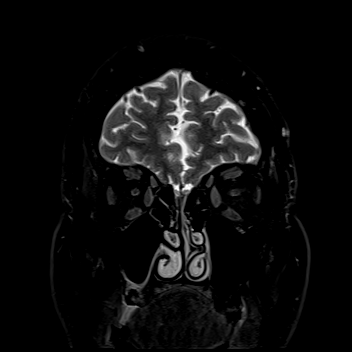
[im 15/25]
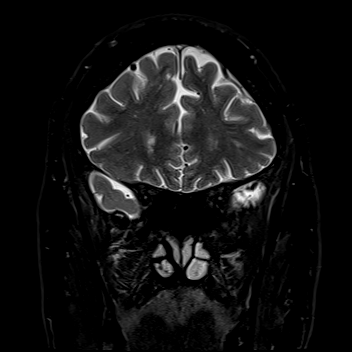
[im 20/25]
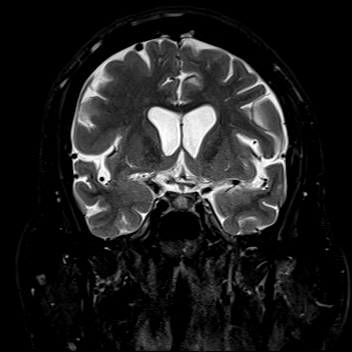
[im 25/25]
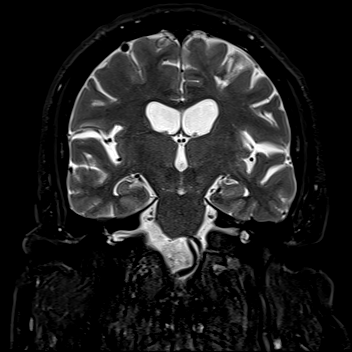

[Series 12: T1 · coronal · 3.0mm · 0.37mm/px · 1 of 25 slices shown (2 of 2)]
[im 1/25]
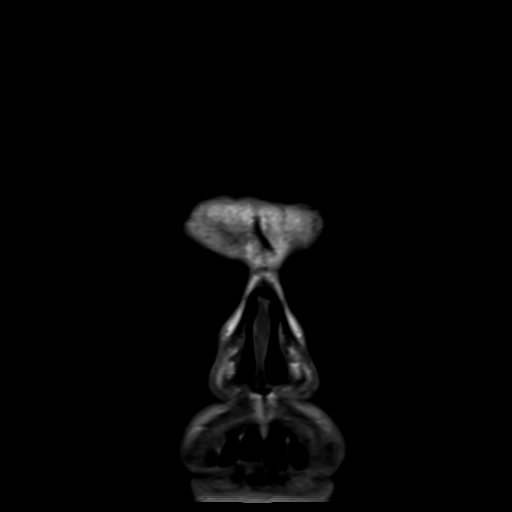

[33 of 48 positions shown; findings below may reference images not displayed]

FINDINGS: MRI HEAD FINDINGS

Brain: No acute infarct, mass effect or extra-axial collection. No
acute or chronic hemorrhage. Normal white matter signal, parenchymal
volume and CSF spaces. The midline structures are normal. Old small
vessel infarct of the left corona radiata. Small amount of
associated chronic blood products.

Vascular: Major flow voids are preserved.

Skull and upper cervical spine: Normal calvarium and skull base.
Visualized upper cervical spine and soft tissues are normal.

Sinuses/Orbits:No paranasal sinus fluid levels or advanced mucosal
thickening. No mastoid or middle ear effusion. Normal orbits.

MRI ORBITS FINDINGS

Orbits:

--Globes: Normal.

--Bony orbit: Normal.

--Preseptal soft tissues: Normal.

--Intra- and extraconal orbital fat: Normal. No inflammatory
stranding.

--Optic nerves: Normal.

--Lacrimal glands and fossae: Normal.

--Extraocular muscles: Normal.

Visualized sinuses:  No fluid levels or advanced mucosal thickening.

Soft tissues: Normal.

Limited intracranial: Normal.
IMPRESSION: 1. No acute intracranial abnormality.
2. Old small vessel infarct of the left corona radiata.
3. Normal MRI of the orbits.

## 2021-04-05 MED ORDER — GADOBUTROL 1 MMOL/ML IV SOLN
10.0000 mL | Freq: Once | INTRAVENOUS | Status: AC | PRN
  Administered 2021-04-05: 10 mL via INTRAVENOUS

## 2021-04-05 MED ORDER — LORAZEPAM 2 MG/ML IJ SOLN
1.0000 mg | Freq: Once | INTRAMUSCULAR | Status: AC | PRN
  Administered 2021-04-05: 1 mg via INTRAVENOUS
  Filled 2021-04-05: qty 1

## 2021-04-05 NOTE — ED Provider Notes (Signed)
[facility] EMERGENCY DEPARTMENT Provider Note   CSN: [id number] Arrival date & time: 04/05/21  1521     History  Chief Complaint  Patient presents with   Blurred Vision    Zachary Salazar is a 56 y.o. male history of hypertension here presenting with right eye blurry vision.  Patient woke up 2 days ago and had blurry vision in the right side.  Patient went to see optometry and then eventually ophthalmology today.  Patient saw [place] [facility] and had a dilated eye exam.  He was noted to have papilledema on exam.  Patient had normal eye pressures.  Patient was sent in for MRI orbits and brain to rule out tumor or optic neuritis.  Patient denies any eye pain.  The history is provided by the patient.      Home Medications Prior to Admission medications   Medication Sig Start Date End Date Taking? Authorizing Provider  DULoxetine (CYMBALTA) 30 MG capsule Take 30 mg by mouth daily after breakfast.  11/19/18   [provider]  hydrochlorothiazide (HYDRODIURIL) 25 MG tablet Take 25 mg by mouth daily after breakfast.  11/19/18   [provider]  NOVOLOG MIX 70/30 (70-30) 100 UNIT/ML injection Inject 50-60 Units into the skin See admin instructions. Inject 60 units every morning and 50 units every evening 07/29/18   [provider]  pantoprazole (PROTONIX) 40 MG tablet Take 40 mg by mouth daily after breakfast.  11/19/18   [provider]      Allergies    Nsaids    Review of Systems   Review of Systems  Eyes:  Positive for visual disturbance.  All other systems reviewed and are negative.  Physical Exam Updated Vital Signs BP (!) 175/110 (BP Location: Right Arm)    Pulse (!) 107    Temp 98.8 F (37.1 C) (Oral)    Resp 18    Ht 5\' 11"  (1.803 m)    Wt 136.1 kg    SpO2 100%    BMI 41.84 kg/m  Physical Exam Vitals and nursing note reviewed.  Constitutional:      Appearance: Normal appearance.  HENT:     Head: Normocephalic.      Nose: Nose normal.     Mouth/Throat:     Mouth: Mucous membranes are moist.  Eyes:     Extraocular Movements: Extraocular movements intact.     Pupils: Pupils are equal, round, and reactive to light.     Comments: Bilateral pupils are dilated from eye exam ophthalmology office  Cardiovascular:     Rate and Rhythm: Normal rate and regular rhythm.     Pulses: Normal pulses.     Heart sounds: Normal heart sounds.  Pulmonary:     Effort: Pulmonary effort is normal.     Breath sounds: Normal breath sounds.  Abdominal:     General: Abdomen is flat.     Palpations: Abdomen is soft.  Musculoskeletal:        General: Normal range of motion.     Cervical back: Normal range of motion and neck supple.  Skin:    General: Skin is warm.     Capillary Refill: Capillary refill takes less than 2 seconds.  Neurological:     General: No focal deficit present.     Mental Status: He is alert and oriented to person, place, and time.     Cranial Nerves: No cranial nerve deficit.     Sensory: No sensory deficit.  Motor: No weakness.     Coordination: Coordination normal.     Comments: Patient has normal strength and sensation bilateral arms and legs.  Patient has no obvious facial droop  Psychiatric:        Mood and Affect: Mood normal.        Behavior: Behavior normal.    ED Results / Procedures / Treatments   Labs (all labs ordered are listed, but only abnormal results are displayed) Labs Reviewed  CBC WITH DIFFERENTIAL/PLATELET - Abnormal; Notable for the following components:      Result Value   Hemoglobin 12.7 (*)    HCT 38.8 (*)    All other components within normal limits  BASIC METABOLIC PANEL    EKG None  Radiology No results found.  Procedures Procedures    Medications Ordered in ED Medications  LORazepam (ATIVAN) injection 1 mg (has no administration in time range)    ED Course/ Medical Decision Making/ A&P                           Medical Decision  Making Zachary Salazar is a 56 y.o. male here presenting with right eye blurry vision for several days.  Patient was noted to have papilledema on eye exam today.  Plan to get MRI brain and orbits with and without contrast to rule out mass versus optic neuritis.  We will get basic blood work as well.  Patient is claustrophobic so we will give Ativan before MRI.  10:23 PM I reviewed his labs and MRI.  MRI did not show any acute stroke.  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  Patient had normal MRI of the orbits.  I talked to Dr. Theda Sers from neurology.  He states that at this point, patient does not need any emergent neurologic evaluation.  Patient can follow-up with ophthalmology outpatient  Problems Addressed: Blurred vision, right eye: acute illness or injury Papilledema: acute illness or injury  Amount and/or Complexity of Data Reviewed Labs: ordered. Decision-making details documented in ED Course. Radiology: ordered and independent interpretation performed. Decision-making details documented in ED Course.  Risk Prescription drug management.  Final Clinical Impression(s) / ED Diagnoses Final diagnoses:  None    Rx / DC Orders ED Discharge Orders     None         Drenda Freeze, MD 04/05/21 2225

## 2021-04-05 NOTE — ED Triage Notes (Addendum)
Patient arrives ambulatory POV c/o blurry vision to right eye starting Sunday when waking up. Patient denies any weakness or numbness to one side. Only c/o pain to groin area from surgery in December. Patient coming from [facility] for further eval.

## 2021-04-05 NOTE — ED Notes (Signed)
Patient transported to MRI 

## 2021-04-05 NOTE — Discharge Instructions (Signed)
Your MRI today did not show any mass or multiple sclerosis or acute stroke or any particular abnormality in your eye  You need to follow-up with your eye doctor and may need to be fitted for glasses  Return to ER if you have worse blurry vision, trouble speaking, weakness, numbness

## 2021-04-05 NOTE — ED Provider Triage Note (Signed)
Emergency Medicine Provider Triage Evaluation Note  Zachary Salazar , a 56 y.o. male  was evaluated in triage.  Pt complains of vision loss in the right eye over the last 3 days.  Was seen at [facility] and sent here for MRI brain and orbits for further evaluation.  No focal weakness or numbness  Review of Systems  Positive:  Negative: See above  Physical Exam  BP (!) 175/110 (BP Location: Right Arm)    Pulse (!) 107    Temp 98.8 F (37.1 C) (Oral)    Resp 18    Ht 5\' 11"  (1.803 m)    Wt 136.1 kg    SpO2 100%    BMI 41.84 kg/m  Gen:   Awake, no distress   Resp:  Normal effort  MSK:   Moves extremities without difficulty  Other:  Pupils are dilated.  5/5 strength to the upper and lower extremities.  Normal sensation to the upper and lower extremities.  No pronator drift.  Medical Decision Making  Medically screening exam initiated at 3:58 PM.  Appropriate orders placed.  Zachary Salazar was informed that the remainder of the evaluation will be completed by another provider, this initial triage assessment does not replace that evaluation, and the importance of remaining in the ED until their evaluation is complete.     Zachary Salazar, [place] 04/05/21 1559
